# Patient Record
Sex: Male | Born: 1995 | Race: White | Hispanic: No | Marital: Single | Smoking: Never smoker
Health system: Southern US, Community
[De-identification: ages and names within clinical notes are randomized; demographics above are authoritative.]

---
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
## 2015-03-21 ENCOUNTER — Encounter: Payer: Self-pay | Admitting: Physician Assistant

## 2015-03-21 ENCOUNTER — Ambulatory Visit (INDEPENDENT_AMBULATORY_CARE_PROVIDER_SITE_OTHER): Payer: Medicaid Other | Admitting: Physician Assistant

## 2015-03-21 VITALS — BP 122/62 | HR 76 | Temp 97.8°F | Resp 16 | Wt 185.0 lb

## 2015-03-21 DIAGNOSIS — M62830 Muscle spasm of back: Secondary | ICD-10-CM | POA: Diagnosis not present

## 2015-03-21 DIAGNOSIS — S39012A Strain of muscle, fascia and tendon of lower back, initial encounter: Secondary | ICD-10-CM | POA: Diagnosis not present

## 2015-03-21 MED ORDER — PREDNISONE 10 MG PO TABS: ORAL_TABLET | ORAL | Status: DC

## 2015-03-21 MED ORDER — BACLOFEN 10 MG PO TABS: 10.0000 mg | ORAL_TABLET | Freq: Three times a day (TID) | ORAL | Status: DC

## 2015-03-21 NOTE — Patient Instructions (Signed)
Lumbosacral Strain Lumbosacral strain is a strain of any of the parts that make up your lumbosacral vertebrae. Your lumbosacral vertebrae are the bones that make up the lower third of your backbone. Your lumbosacral vertebrae are held together by muscles and tough, fibrous tissue (ligaments).  CAUSES  A sudden blow to your back can cause lumbosacral strain. Also, anything that causes an excessive stretch of the muscles in the low back can cause this strain. This is typically seen when people exert themselves strenuously, fall, lift heavy objects, bend, or crouch repeatedly. RISK FACTORS  Physically demanding work.  Participation in pushing or pulling sports or sports that require a sudden twist of the back (tennis, golf, baseball).  Weight lifting.  Excessive lower back curvature.  Forward-tilted pelvis.  Weak back or abdominal muscles or both.  Tight hamstrings. SIGNS AND SYMPTOMS  Lumbosacral strain may cause pain in the area of your injury or pain that moves (radiates) down your leg.  DIAGNOSIS Your health care provider can often diagnose lumbosacral strain through a physical exam. In some cases, you may need tests such as X-ray exams.  TREATMENT  Treatment for your lower back injury depends on many factors that your clinician will have to evaluate. However, most treatment will include the use of anti-inflammatory medicines. HOME CARE INSTRUCTIONS   Avoid hard physical activities (tennis, racquetball, waterskiing) if you are not in proper physical condition for it. This may aggravate or create problems.  If you have a back problem, avoid sports requiring sudden body movements. Swimming and walking are generally safer activities.  Maintain good posture.  Maintain a healthy weight.  For acute conditions, you may put ice on the injured area.  Put ice in a plastic bag.  Place a towel between your skin and the bag.  Leave the ice on for 20 minutes, 2-3 times a day.  When the  low back starts healing, stretching and strengthening exercises may be recommended. SEEK MEDICAL CARE IF:  Your back pain is getting worse.  You experience severe back pain not relieved with medicines. SEEK IMMEDIATE MEDICAL CARE IF:   You have numbness, tingling, weakness, or problems with the use of your arms or legs.  There is a change in bowel or bladder control.  You have increasing pain in any area of the body, including your belly (abdomen).  You notice shortness of breath, dizziness, or feel faint.  You feel sick to your stomach (nauseous), are throwing up (vomiting), or become sweaty.  You notice discoloration of your toes or legs, or your feet get very cold. MAKE SURE YOU:   Understand these instructions.  Will watch your condition.  Will get help right away if you are not doing well or get worse.   This information is not intended to replace advice given to you by your health care provider. Make sure you discuss any questions you have with your health care provider.   Document Released: 10/01/2004 Document Revised: 01/12/2014 Document Reviewed: 08/10/2012 Elsevier Interactive Patient Education 2016 Elsevier Inc. Muscle Cramps and Spasms Muscle cramps and spasms occur when a muscle or muscles tighten and you have no control over this tightening (involuntary muscle contraction). They are a common problem and can develop in any muscle. The most common place is in the calf muscles of the leg. Both muscle cramps and muscle spasms are involuntary muscle contractions, but they also have differences:   Muscle cramps are sporadic and painful. They may last a few seconds to a quarter  of an hour. Muscle cramps are often more forceful and last longer than muscle spasms. °· Muscle spasms may or may not be painful. They may also last just a few seconds or much longer. °CAUSES  °It is uncommon for cramps or spasms to be due to a serious underlying problem. In many cases, the cause of  cramps or spasms is unknown. Some common causes are:  °· Overexertion.   °· Overuse from repetitive motions (doing the same thing over and over).   °· Remaining in a certain position for a long period of time.   °· Improper preparation, form, or technique while performing a sport or activity.   °· Dehydration.   °· Injury.   °· Side effects of some medicines.   °· Abnormally low levels of the salts and ions in your blood (electrolytes), especially potassium and calcium. This could happen if you are taking water pills (diuretics) or you are pregnant.   °Some underlying medical problems can make it more likely to develop cramps or spasms. These include, but are not limited to:  °· Diabetes.   °· Parkinson disease.   °· Hormone disorders, such as thyroid problems.   °· Alcohol abuse.   °· Diseases specific to muscles, joints, and bones.   °· Blood vessel disease where not enough blood is getting to the muscles.   °HOME CARE INSTRUCTIONS  °· Stay well hydrated. Drink enough water and fluids to keep your urine clear or pale yellow. °· It may be helpful to massage, stretch, and relax the affected muscle. °· For tight or tense muscles, use a warm towel, heating pad, or hot shower water directed to the affected area. °· If you are sore or have pain after a cramp or spasm, applying ice to the affected area may relieve discomfort. °· Put ice in a plastic bag. °· Place a towel between your skin and the bag. °· Leave the ice on for 15-20 minutes, 03-04 times a day. °· Medicines used to treat a known cause of cramps or spasms may help reduce their frequency or severity. Only take over-the-counter or prescription medicines as directed by your caregiver. °SEEK MEDICAL CARE IF:  °Your cramps or spasms get more severe, more frequent, or do not improve over time.  °MAKE SURE YOU:  °· Understand these instructions. °· Will watch your condition. °· Will get help right away if you are not doing well or get worse. °  °This information is  not intended to replace advice given to you by your health care provider. Make sure you discuss any questions you have with your health care provider. °  °Document Released: 06/13/2001 Document Revised: 04/18/2012 Document Reviewed: 12/09/2011 °Elsevier Interactive Patient Education ©2016 Elsevier Inc. ° °
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
## 2016-06-05 ENCOUNTER — Ambulatory Visit (INDEPENDENT_AMBULATORY_CARE_PROVIDER_SITE_OTHER): Payer: Medicaid Other | Admitting: Family Medicine

## 2016-06-05 ENCOUNTER — Encounter: Payer: Self-pay | Admitting: Family Medicine

## 2016-06-05 ENCOUNTER — Other Ambulatory Visit: Payer: Self-pay | Admitting: Family Medicine

## 2016-06-05 ENCOUNTER — Telehealth: Payer: Self-pay

## 2016-06-05 VITALS — BP 102/74 | HR 80 | Temp 97.6°F | Resp 16 | Wt 169.0 lb

## 2016-06-05 DIAGNOSIS — L255 Unspecified contact dermatitis due to plants, except food: Secondary | ICD-10-CM | POA: Diagnosis not present

## 2016-06-05 MED ORDER — BETAMETHASONE DIPROPIONATE 0.05 % EX CREA: TOPICAL_CREAM | Freq: Two times a day (BID) | CUTANEOUS | 0 refills | Status: DC

## 2016-06-05 MED ORDER — METHYLPREDNISOLONE ACETATE 80 MG/ML IJ SUSP
80.0000 mg | Freq: Once | INTRAMUSCULAR | Status: AC
  Administered 2016-06-05: 80 mg via INTRAMUSCULAR

## 2016-06-05 MED ORDER — FLUOCINONIDE 0.05 % EX CREA: 1.0000 "application " | TOPICAL_CREAM | Freq: Three times a day (TID) | CUTANEOUS | 0 refills | Status: DC
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
## 2016-06-05 NOTE — Patient Instructions (Signed)
Continue antihistamines. Warm bath may help at night.

## 2016-06-05 NOTE — Telephone Encounter (Signed)
[facility] sent over 2 PA requests for different topical steroids for poison ivy. Per last e-prescription (Diprolene), pharmacist may select high potency topical steroid covered by patient' insurance. [facility] and [facility] informed me that the pharmacy filled Clobetasol ointment. Allene DillonEmily Drozdowski, CMA

## 2016-12-05 ENCOUNTER — Encounter: Payer: Self-pay | Admitting: Emergency Medicine

## 2016-12-05 ENCOUNTER — Emergency Department
Admission: EM | Admit: 2016-12-05 | Discharge: 2016-12-05 | Disposition: A | Discharge status: Home / Self Care | Payer: Medicaid Other | Attending: Emergency Medicine | Admitting: Emergency Medicine

## 2016-12-05 ENCOUNTER — Other Ambulatory Visit: Payer: Self-pay

## 2016-12-05 DIAGNOSIS — F17220 Nicotine dependence, chewing tobacco, uncomplicated: Secondary | ICD-10-CM | POA: Diagnosis not present

## 2016-12-05 DIAGNOSIS — S0181XA Laceration without foreign body of other part of head, initial encounter: Secondary | ICD-10-CM | POA: Diagnosis present

## 2016-12-05 DIAGNOSIS — Y999 Unspecified external cause status: Secondary | ICD-10-CM | POA: Diagnosis not present

## 2016-12-05 DIAGNOSIS — Z880 Allergy status to penicillin: Secondary | ICD-10-CM | POA: Insufficient documentation

## 2016-12-05 DIAGNOSIS — W2202XA Walked into lamppost, initial encounter: Secondary | ICD-10-CM | POA: Diagnosis not present

## 2016-12-05 DIAGNOSIS — Y929 Unspecified place or not applicable: Secondary | ICD-10-CM | POA: Diagnosis not present

## 2016-12-05 DIAGNOSIS — Y93K9 Activity, other involving animal care: Secondary | ICD-10-CM | POA: Insufficient documentation

## 2016-12-05 MED ORDER — LIDOCAINE HCL (PF) 1 % IJ SOLN
5.0000 mL | Freq: Once | INTRAMUSCULAR | Status: AC
  Administered 2016-12-05: 5 mL

## 2016-12-05 MED ORDER — LIDOCAINE HCL (PF) 1 % IJ SOLN
INTRAMUSCULAR | Status: AC
  Administered 2016-12-05: 5 mL
  Filled 2016-12-05: qty 5

## 2016-12-05 NOTE — ED Notes (Signed)
See triage note  States he was chasing a goat    Ran into a pole  Bruising and small laceration noted to left eye  Denies any other pain or LOC

## 2016-12-05 NOTE — ED Provider Notes (Signed)
[facility] Emergency Department Provider Note  ____________________________________________  Time seen: Approximately 3:41 PM  I have reviewed the triage vital signs and the nursing notes.   HISTORY  Chief Complaint Laceration    HPI Roxy MannsJonathan J Savitz is a 21 y.o. male presents to the emergency department with a 2 cm vertical laceration along left forehead after he collided with a pole while chasing a goat.  He denies loss of consciousness.  He denies radiculopathy, weakness or changes in sensation in the upper or lower extremities.  No alleviating measures have been attempted aside from the application of a clean dressing.   History reviewed. No pertinent past medical history.  There are no active problems to display for this patient.   History reviewed. No pertinent surgical history.  Prior to Admission medications   Medication Sig Start Date End Date Taking? Authorizing Provider  betamethasone dipropionate (DIPROLENE) 0.05 % cream Apply topically 2 (two) times daily. To poison ivy rash 06/05/16   Anola Gurneyhauvin, Robert, PA    Allergies Amoxicillin; Azithromycin; Benadryl [diphenhydramine hcl (sleep)]; and Cephalosporins  No family history on file.  Social History Social History   Tobacco Use  . Smoking status: Never Smoker  . Smokeless tobacco: Current User    Types: Snuff  Substance Use Topics  . Alcohol use: No    Alcohol/week: 0.0 oz  . Drug use: Not on file     Review of Systems  Constitutional: No fever/chills Eyes: No visual changes. No discharge ENT: No upper respiratory complaints. Cardiovascular: no chest pain. Respiratory: no cough. No SOB. Gastrointestinal: No abdominal pain.  No nausea, no vomiting.  No diarrhea.  No constipation. Musculoskeletal: Negative for musculoskeletal pain. Skin: Patient has laceration along the left forehead.  Neurological: Negative for headaches, focal weakness or  numbness.   ____________________________________________   PHYSICAL EXAM:  VITAL SIGNS: ED Triage Vitals  Enc Vitals Group     BP 12/05/16 1258 (!) 142/82     Pulse Rate 12/05/16 1258 94     Resp 12/05/16 1258 18     Temp 12/05/16 1258 98.3 F (36.8 C)     Temp Source 12/05/16 1258 Oral     SpO2 12/05/16 1258 99 %     Weight 12/05/16 1259 180 lb (81.6 kg)     Height 12/05/16 1259 5\' 11"  (1.803 m)     Head Circumference --      Peak Flow --      Pain Score 12/05/16 1257 2     Pain Loc --      Pain Edu? --      Excl. in GC? --      Constitutional: Alert and oriented. Well appearing and in no acute distress. Eyes: Conjunctivae are normal. PERRL. EOMI. Head: Atraumatic. Cardiovascular: Normal rate, regular rhythm. Normal S1 and S2.  Good peripheral circulation. Respiratory: Normal respiratory effort without tachypnea or retractions. Lungs CTAB. Good air entry to the bases with no decreased or absent breath sounds. Gastrointestinal: Bowel sounds 4 quadrants. Soft and nontender to palpation. No guarding or rigidity. No palpable masses. No distention. No CVA tenderness. Musculoskeletal: Full range of motion to all extremities. No gross deformities appreciated. Neurologic:  Normal speech and language. No gross focal neurologic deficits are appreciated.  Skin: Patient has 2 cm vertical laceration along left forehead. Psychiatric: Mood and affect are normal. Speech and behavior are normal. Patient exhibits appropriate insight and judgement.   ____________________________________________   LABS (all labs ordered are listed, but only  abnormal results are displayed)  Labs Reviewed - No data to display ____________________________________________  EKG   ____________________________________________  RADIOLOGY   No results found.  ____________________________________________    PROCEDURES  Procedure(s) performed:    Procedures  LACERATION REPAIR Performed by:  Orvil FeilJaclyn M Crosley Stejskal Authorized by: Orvil FeilJaclyn M Elishah Ashmore Consent: Verbal consent obtained. Risks and benefits: risks, benefits and alternatives were discussed Consent given by: patient Patient identity confirmed: provided demographic data Prepped and Draped in normal sterile fashion Wound explored  Laceration Location: Left forehead  Laceration Length: 2 cm  No Foreign Bodies seen or palpated  Anesthesia: local infiltration  Local anesthetic: lidocaine 1% without epinephrine  Anesthetic total: 3 ml  Irrigation method: syringe Amount of cleaning: standard  Skin closure: 5-0 Ethilon   Number of sutures: 3  Technique: Simple Interrupted   Patient tolerance: Patient tolerated the procedure well with no immediate complications.   Medications  lidocaine (PF) (XYLOCAINE) 1 % injection 5 mL (5 mLs Infiltration Given 12/05/16 1459)     ____________________________________________   INITIAL IMPRESSION / ASSESSMENT AND PLAN / ED COURSE  Pertinent labs & imaging results that were available during my care of the patient were reviewed by me and considered in my medical decision making (see chart for details).  Review of the [place] CSRS was performed in accordance of the [facility] prior to dispensing any controlled drugs.    Assessment and plan Laceration Patient presents to the emergency department with a 2 cm vertical laceration along left forehead.  Patient underwent laceration repair without complication.  He was advised to have sutures removed by primary care in 5 days.  Vital signs were reassuring prior to discharge.  All patient questions were answered. .   ____________________________________________  FINAL CLINICAL IMPRESSION(S) / ED DIAGNOSES  Final diagnoses:  Laceration of forehead, initial encounter      NEW MEDICATIONS STARTED DURING THIS VISIT:  ED Discharge Orders    None          This chart was dictated using voice recognition software/Dragon. Despite best  efforts to proofread, errors can occur which can change the meaning. Any change was purely unintentional.    Orvil FeilWoods, Tayson Schnelle M, PA-C 12/05/16 1547    Merrily Brittleifenbark, Neil, MD 12/06/16 1332

## 2016-12-05 NOTE — ED Triage Notes (Signed)
Hit face on steel pole while chasing a goat approx 12noon. No LOC. Small lac above and below L eye.

## 2017-06-08 ENCOUNTER — Ambulatory Visit (INDEPENDENT_AMBULATORY_CARE_PROVIDER_SITE_OTHER): Payer: Self-pay | Admitting: Physician Assistant

## 2017-06-08 ENCOUNTER — Encounter: Payer: Self-pay | Admitting: Physician Assistant

## 2017-06-08 VITALS — BP 110/78 | HR 73 | Temp 97.6°F | Resp 16 | Wt 183.4 lb

## 2017-06-08 DIAGNOSIS — T63301A Toxic effect of unspecified spider venom, accidental (unintentional), initial encounter: Secondary | ICD-10-CM

## 2017-06-08 MED ORDER — SULFAMETHOXAZOLE-TRIMETHOPRIM 800-160 MG PO TABS: 1.0000 | ORAL_TABLET | Freq: Two times a day (BID) | ORAL | 0 refills | Status: DC

## 2017-06-08 NOTE — Progress Notes (Signed)
       Patient: Dennis Rosales Male    DOB: 1995-05-12   22 y.o.   MRN: [id number] Visit Date: 06/08/2017  Today's Provider: Margaretann LovelessJennifer M Burnette, PA-C   Chief Complaint  Patient presents with  . Insect Bite   Subjective:    HPI Patient is coming in today with c/o possible spider bite,Left arm. Noticed it Saturday morning.Associated Symptoms:swelling, redness, and pain around the bite.  Patient reports his arm feels numb all the way to his elbow.Treatments tried: Epsom salt,Neosporin and IBU.     Allergies  Allergen Reactions  . Amoxicillin Other (See Comments)    Unknown-reaction not told to us  . Azithromycin Other (See Comments)    unknown  . Benadryl [Diphenhydramine Hcl (Sleep)] Nausea Only  . Cephalosporins Other (See Comments)    UNK     Current Outpatient Medications:  .  betamethasone dipropionate (DIPROLENE) 0.05 % cream, Apply topically 2 (two) times daily. To poison ivy rash, Disp: 30 g, Rfl: 0  Review of Systems  Constitutional: Negative.   Respiratory: Negative.   Cardiovascular: Negative.   Musculoskeletal: Positive for myalgias.  Skin: Positive for rash.  Neurological: Positive for weakness and numbness.    Social History   Tobacco Use  . Smoking status: Never Smoker  . Smokeless tobacco: Current User    Types: Snuff  Substance Use Topics  . Alcohol use: No    Alcohol/week: 0.0 oz   Objective:   BP 110/78 (BP Location: Right Arm, Patient Position: Sitting, Cuff Size: Normal)   Pulse 73   Temp 97.6 F (36.4 C) (Oral)   Resp 16   Wt 183 lb 6.4 oz (83.2 kg)   SpO2 98%   BMI 25.58 kg/m    Physical Exam  Constitutional: He appears well-developed and well-nourished. No distress.  HENT:  Head: Normocephalic and atraumatic.  Neck: Normal range of motion. Neck supple.  Cardiovascular: Normal rate, regular rhythm and normal heart sounds. Exam reveals no gallop and no friction rub.  No murmur heard. Pulmonary/Chest: Effort normal and  breath sounds normal. No respiratory distress. He has no wheezes. He has no rales.  Skin: He is not diaphoretic.  See photo below  Vitals reviewed.         Assessment & Plan:     1. Spider bite wound, accidental or unintentional, initial encounter Suspicious appearance for brown recluse bite. Reports went to sleep Saturday and woke up with small pimple area that was tender. It has since continued to progress. Now having arm soreness with movements as well. Will start Bactrim as below. Close follow up scheduled with Dr. Beryle FlockBacigalupo (unfortunately I was booked and could not see him) scheduled for Thursday (June 6) at 8:00am. Call if symptoms worsen.  - sulfamethoxazole-trimethoprim (BACTRIM DS,SEPTRA DS) 800-160 MG tablet; Take 1 tablet by mouth 2 (two) times daily.  Dispense: 20 tablet; Refill: 0       Margaretann LovelessJennifer M Burnette, PA-C  [facility]

## 2017-06-10 ENCOUNTER — Encounter: Payer: Self-pay | Admitting: Family Medicine

## 2017-06-10 ENCOUNTER — Ambulatory Visit (INDEPENDENT_AMBULATORY_CARE_PROVIDER_SITE_OTHER): Payer: Medicaid Other | Admitting: Family Medicine

## 2017-06-10 VITALS — BP 120/68 | HR 56 | Temp 97.5°F | Resp 16 | Wt 184.0 lb

## 2017-06-10 DIAGNOSIS — T63301D Toxic effect of unspecified spider venom, accidental (unintentional), subsequent encounter: Secondary | ICD-10-CM

## 2017-06-10 NOTE — Progress Notes (Signed)
Patient: Dennis Rosales Male    DOB: 10-08-1995   21 y.o.   MRN: [id number] Visit Date: 06/10/2017  Today's Provider: Shirlee Latch, MD   I, Joslyn Hy, CMA, am acting as scribe for Shirlee Latch, MD.  Chief Complaint  Patient presents with  . Insect Bite   Subjective:    HPI     Follow up for Spider Bite  The patient was last seen for possible brown recluse bite 2 days ago. Changes made at last visit include starting Bactrim.  He reports good compliance with treatment. He feels that condition is Improved. Pt states the soreness has improved, but the area itches. He is not having side effects.  ------------------------------------------------------------------------------------    Allergies  Allergen Reactions  . Amoxicillin Other (See Comments)    Unknown-reaction not told to [place]  . Azithromycin Other (See Comments)    unknown  . Benadryl [Diphenhydramine Hcl (Sleep)] Nausea Only  . Cephalosporins Other (See Comments)    UNK     Current Outpatient Medications:  .  sulfamethoxazole-trimethoprim (BACTRIM DS,SEPTRA DS) 800-160 MG tablet, Take 1 tablet by mouth 2 (two) times daily., Disp: 20 tablet, Rfl: 0  Review of Systems  Constitutional: Positive for appetite change (increased). Negative for activity change, chills, diaphoresis, fatigue, fever and unexpected weight change.  Respiratory: Negative for shortness of breath.   Cardiovascular: Negative for chest pain, palpitations and leg swelling.  Skin: Positive for wound.    Social History   Tobacco Use  . Smoking status: Never Smoker  . Smokeless tobacco: Current User    Types: Snuff  Substance Use Topics  . Alcohol use: No    Alcohol/week: 0.0 oz   Objective:   BP 120/68 (BP Location: Right Arm, Patient Position: Sitting, Cuff Size: Large)   Pulse (!) 56   Temp (!) 97.5 F (36.4 C) (Oral)   Resp 16   Wt 184 lb (83.5 kg)   SpO2 99%   BMI 25.66 kg/m  Vitals:   06/10/17 0808    BP: 120/68  Pulse: (!) 56  Resp: 16  Temp: (!) 97.5 F (36.4 C)  TempSrc: Oral  SpO2: 99%  Weight: 184 lb (83.5 kg)     Physical Exam  Constitutional: He is oriented to person, place, and time. He appears well-developed and well-nourished. No distress.  HENT:  Head: Normocephalic and atraumatic.  Eyes: Conjunctivae are normal. Right eye exhibits no discharge. Left eye exhibits no discharge. No scleral icterus.  Cardiovascular: Normal rate, regular rhythm, normal heart sounds and intact distal pulses.  No murmur heard. Pulmonary/Chest: Effort normal and breath sounds normal. No respiratory distress. He has no wheezes. He has no rales.  Musculoskeletal: He exhibits no edema.  Neurological: He is alert and oriented to person, place, and time.  Skin: Skin is warm and dry. Capillary refill takes less than 2 seconds.  See picture.  No surrounding erythema.  No induration.  Area of central necrosis is smaller than previously  Psychiatric: He has a normal mood and affect. His behavior is normal.  Vitals reviewed.          Assessment & Plan:   1. Spider bite wound, accidental or unintentional, subsequent encounter - improving - complete course of Bactrim - no indication for I&D - return precautions discussed   Return if symptoms worsen or fail to improve.   The entirety of the information documented in the History of Present Illness, Review of Systems and Physical Exam were  personally obtained by me. Portions of this information were initially documented by Irving BurtonEmily Ratchford, CMA and reviewed by me for thoroughness and accuracy.    Erasmo DownerBacigalupo, Shamyah Stantz M, MD, MPH [facility] 06/10/2017 8:27 AM

## 2018-11-02 ENCOUNTER — Other Ambulatory Visit: Payer: Self-pay

## 2018-11-02 ENCOUNTER — Encounter: Payer: Self-pay | Admitting: Physician Assistant

## 2018-11-02 ENCOUNTER — Ambulatory Visit (INDEPENDENT_AMBULATORY_CARE_PROVIDER_SITE_OTHER): Payer: Self-pay | Admitting: Physician Assistant

## 2018-11-02 VITALS — BP 114/70 | HR 66 | Temp 97.3°F | Resp 16 | Wt 210.2 lb

## 2018-11-02 DIAGNOSIS — B029 Zoster without complications: Secondary | ICD-10-CM

## 2018-11-02 MED ORDER — VALACYCLOVIR HCL 1 G PO TABS: 1000.0000 mg | ORAL_TABLET | Freq: Three times a day (TID) | ORAL | 0 refills | Status: DC

## 2018-11-02 NOTE — Progress Notes (Signed)
Patient: Dennis Rosales Male    DOB: 01/15/1995   23 y.o.   MRN: [id number] Visit Date: 11/02/2018  Today's Provider: Mar Daring, PA-C   Chief Complaint  Patient presents with  . Rash   Subjective:     Rash This is a new problem. The current episode started in the past 7 days. The problem has been gradually worsening since onset. The affected locations include the back and chest. The rash is characterized by burning, blistering, pain, redness and itchiness. It is unknown if there was an exposure to a precipitant. Pertinent negatives include no anorexia, congestion, cough, diarrhea, eye pain, facial edema, fatigue, fever, joint pain, nail changes, rhinorrhea, shortness of breath, sore throat or vomiting. Treatments tried: hydrocortisone cream. The treatment provided no relief.    Allergies  Allergen Reactions  . Amoxicillin Other (See Comments)    Unknown-reaction not told to [place]  . Azithromycin Other (See Comments)    unknown  . Benadryl [Diphenhydramine Hcl (Sleep)] Nausea Only  . Cephalosporins Other (See Comments)    UNK    No current outpatient medications on file.  Review of Systems  Constitutional: Negative for fatigue and fever.  HENT: Negative for congestion, rhinorrhea and sore throat.   Eyes: Negative for pain.  Respiratory: Negative for cough and shortness of breath.   Gastrointestinal: Negative for anorexia, diarrhea and vomiting.  Musculoskeletal: Negative for joint pain.  Skin: Positive for rash. Negative for nail changes.    Social History   Tobacco Use  . Smoking status: Never Smoker  . Smokeless tobacco: Current User    Types: Snuff  Substance Use Topics  . Alcohol use: No    Alcohol/week: 0.0 standard drinks      Objective:   BP 114/70   Pulse 66   Temp (!) 97.3 F (36.3 C) (Oral)   Resp 16   Wt 210 lb 3.2 oz (95.3 kg)   BMI 29.32 kg/m  Vitals:   11/02/18 1614  BP: 114/70  Pulse: 66  Resp: 16  Temp: (!) 97.3 F  (36.3 C)  TempSrc: Oral  Weight: 210 lb 3.2 oz (95.3 kg)  Body mass index is 29.32 kg/m.   Physical Exam Vitals signs reviewed.  Constitutional:      General: He is not in acute distress.    Appearance: Normal appearance. He is well-developed. He is not ill-appearing or diaphoretic.  HENT:     Head: Normocephalic and atraumatic.  Neck:     Musculoskeletal: Normal range of motion and neck supple.  Cardiovascular:     Rate and Rhythm: Normal rate and regular rhythm.     Heart sounds: Normal heart sounds. No murmur. No friction rub. No gallop.   Pulmonary:     Effort: Pulmonary effort is normal. No respiratory distress.     Breath sounds: Normal breath sounds. No wheezing or rales.  Skin:    Findings: Rash present. Rash is papular and vesicular.          Comments: Vesicular rash in dermatomal pattern around the right side chest and upper back  Neurological:     Mental Status: He is alert.      No results found for any visits on 11/02/18.     Assessment & Plan    1. Herpes zoster without complication Treat with Valtrex as below. Discussed suspected course of rash. Discussed ability to spread and high risk populations to avoid. He voices understanding. Call if not improving  or worsening.  - valACYclovir (VALTREX) 1000 MG tablet; Take 1 tablet (1,000 mg total) by mouth 3 (three) times daily.  Dispense: 21 tablet; Refill: 0     Margaretann Loveless, PA-C  [facility]

## 2018-11-02 NOTE — Patient Instructions (Signed)
Shingles  Shingles, which is also known as herpes zoster, is an infection that causes a painful skin rash and fluid-filled blisters. It is caused by a virus. Shingles only develops in people who:  Have had chickenpox.  Have been given a medicine to protect against chickenpox (have been vaccinated). Shingles is rare in this group. What are the causes? Shingles is caused by varicella-zoster virus (VZV). This is the same virus that causes chickenpox. After a person is exposed to VZV, the virus stays in the body in an inactive (dormant) state. Shingles develops if the virus is reactivated. This can happen many years after the first (initial) exposure to VZV. It is not known what causes this virus to be reactivated. What increases the risk? People who have had chickenpox or received the chickenpox vaccine are at risk for shingles. Shingles infection is more common in people who:  Are older than age 60.  Have a weakened disease-fighting system (immune system), such as people with: ? HIV. ? AIDS. ? Cancer.  Are taking medicines that weaken the immune system, such as transplant medicines.  Are experiencing a lot of stress. What are the signs or symptoms? Early symptoms of this condition include itching, tingling, and pain in an area on your skin. Pain may be described as burning, stabbing, or throbbing. A few days or weeks after early symptoms start, a painful red rash appears. The rash is usually on one side of the body and has a band-like or belt-like pattern. The rash eventually turns into fluid-filled blisters that break open, change into scabs, and dry up in about 2-3 weeks. At any time during the infection, you may also develop:  A fever.  Chills.  A headache.  An upset stomach. How is this diagnosed? This condition is diagnosed with a skin exam. Skin or fluid samples may be taken from the blisters before a diagnosis is made. These samples are examined under a microscope or sent to  a lab for testing. How is this treated? The rash may last for several weeks. There is not a specific cure for this condition. Your health care provider will probably prescribe medicines to help you manage pain, recover more quickly, and avoid long-term problems. Medicines may include:  Antiviral drugs.  Anti-inflammatory drugs.  Pain medicines.  Anti-itching medicines (antihistamines). If the area involved is on your face, you may be referred to a specialist, such as an eye doctor (ophthalmologist) or an ear, nose, and throat (ENT) doctor (otolaryngologist) to help you avoid eye problems, chronic pain, or disability. Follow these instructions at home: Medicines  Take over-the-counter and prescription medicines only as told by your health care provider.  Apply an anti-itch cream or numbing cream to the affected area as told by your health care provider. Relieving itching and discomfort   Apply cold, wet cloths (cold compresses) to the area of the rash or blisters as told by your health care provider.  Cool baths can be soothing. Try adding baking soda or dry oatmeal to the water to reduce itching. Do not bathe in hot water. Blister and rash care  Keep your rash covered with a loose bandage (dressing). Wear loose-fitting clothing to help ease the pain of material rubbing against the rash.  Keep your rash and blisters clean by washing the area with mild soap and cool water as told by your health care provider.  Check your rash every day for signs of infection. Check for: ? More redness, swelling, or pain. ? Fluid   or blood. ? Warmth. ? Pus or a bad smell.  Do not scratch your rash or pick at your blisters. To help avoid scratching: ? Keep your fingernails clean and cut short. ? Wear gloves or mittens while you sleep, if scratching is a problem. General instructions  Rest as told by your health care provider.  Keep all follow-up visits as told by your health care provider. This  is important.  Wash your hands often with soap and water. If soap and water are not available, use hand sanitizer. Doing this lowers your chance of getting a bacterial skin infection.  Before your blisters change into scabs, your shingles infection can cause chickenpox in people who have never had it or have never been vaccinated against it. To prevent this from happening, avoid contact with other people, especially: ? Babies. ? Pregnant women. ? Children who have eczema. ? Elderly people who have transplants. ? People who have chronic illnesses, such as cancer or AIDS. Contact a health care provider if:  Your pain is not relieved with prescribed medicines.  Your pain does not get better after the rash heals.  You have signs of infection in the rash area, such as: ? More redness, swelling, or pain around the rash. ? Fluid or blood coming from the rash. ? The rash area feeling warm to the touch. ? Pus or a bad smell coming from the rash. Get help right away if:  The rash is on your face or nose.  You have facial pain, pain around your eye area, or loss of feeling on one side of your face.  You have difficulty seeing.  You have ear pain or have ringing in your ear.  You have a loss of taste.  Your condition gets worse. Summary  Shingles, which is also known as herpes zoster, is an infection that causes a painful skin rash and fluid-filled blisters.  This condition is diagnosed with a skin exam. Skin or fluid samples may be taken from the blisters and examined before the diagnosis is made.  Keep your rash covered with a loose bandage (dressing). Wear loose-fitting clothing to help ease the pain of material rubbing against the rash.  Before your blisters change into scabs, your shingles infection can cause chickenpox in people who have never had it or have never been vaccinated against it. This information is not intended to replace advice given to you by your health care  provider. Make sure you discuss any questions you have with your health care provider. Document Released: 12/22/2004 Document Revised: 04/15/2018 Document Reviewed: 08/26/2016 Elsevier Patient Education  2020 [employer] Inc.  

## 2019-02-08 ENCOUNTER — Ambulatory Visit
Admission: EM | Admit: 2019-02-08 | Discharge: 2019-02-08 | Disposition: A | Discharge status: Home / Self Care | Payer: BC Managed Care – PPO | Attending: Family | Admitting: Family

## 2019-02-08 ENCOUNTER — Other Ambulatory Visit: Payer: Self-pay

## 2019-02-08 DIAGNOSIS — Z888 Allergy status to other drugs, medicaments and biological substances status: Secondary | ICD-10-CM | POA: Diagnosis not present

## 2019-02-08 DIAGNOSIS — Z20822 Contact with and (suspected) exposure to covid-19: Secondary | ICD-10-CM | POA: Insufficient documentation

## 2019-02-08 DIAGNOSIS — J039 Acute tonsillitis, unspecified: Secondary | ICD-10-CM | POA: Diagnosis not present

## 2019-02-08 DIAGNOSIS — Z881 Allergy status to other antibiotic agents status: Secondary | ICD-10-CM | POA: Diagnosis not present

## 2019-02-08 DIAGNOSIS — J029 Acute pharyngitis, unspecified: Secondary | ICD-10-CM

## 2019-02-08 DIAGNOSIS — Z880 Allergy status to penicillin: Secondary | ICD-10-CM | POA: Diagnosis not present

## 2019-02-08 DIAGNOSIS — G4489 Other headache syndrome: Secondary | ICD-10-CM | POA: Diagnosis not present

## 2019-02-08 DIAGNOSIS — R519 Headache, unspecified: Secondary | ICD-10-CM

## 2019-02-08 LAB — CBC WITH DIFFERENTIAL/PLATELET
Abs Immature Granulocytes: 0.08 10*3/uL — ABNORMAL HIGH (ref 0.00–0.07)
Basophils Absolute: 0 10*3/uL (ref 0.0–0.1)
Basophils Relative: 0 %
Eosinophils Absolute: 0 10*3/uL (ref 0.0–0.5)
Eosinophils Relative: 0 %
HCT: 44.2 % (ref 39.0–52.0)
Hemoglobin: 15.4 g/dL (ref 13.0–17.0)
Immature Granulocytes: 1 %
Lymphocytes Relative: 6 %
Lymphs Abs: 0.7 10*3/uL (ref 0.7–4.0)
MCH: 32.3 pg (ref 26.0–34.0)
MCHC: 34.8 g/dL (ref 30.0–36.0)
MCV: 92.7 fL (ref 80.0–100.0)
Monocytes Absolute: 1.3 10*3/uL — ABNORMAL HIGH (ref 0.1–1.0)
Monocytes Relative: 11 %
Neutro Abs: 10.4 10*3/uL — ABNORMAL HIGH (ref 1.7–7.7)
Neutrophils Relative %: 82 %
Platelets: 204 10*3/uL (ref 150–400)
RBC: 4.77 MIL/uL (ref 4.22–5.81)
RDW: 11.9 % (ref 11.5–15.5)
WBC: 12.6 10*3/uL — ABNORMAL HIGH (ref 4.0–10.5)
nRBC: 0 % (ref 0.0–0.2)

## 2019-02-08 LAB — MONONUCLEOSIS SCREEN: Mono Screen: NEGATIVE

## 2019-02-08 LAB — GROUP A STREP BY PCR: Group A Strep by PCR: NOT DETECTED

## 2019-02-08 MED ORDER — ACETAMINOPHEN 500 MG PO TABS
1000.0000 mg | ORAL_TABLET | Freq: Once | ORAL | Status: AC
  Administered 2019-02-08: 10:00:00 1000 mg via ORAL

## 2019-02-08 MED ORDER — CLINDAMYCIN HCL 300 MG PO CAPS: 300.0000 mg | ORAL_CAPSULE | Freq: Three times a day (TID) | ORAL | 0 refills | Status: AC

## 2019-02-08 NOTE — ED Provider Notes (Signed)
[facility]    CSN: [id number] Arrival date & time: 02/08/19  0803      History   Chief Complaint Chief Complaint  Patient presents with  . Headache  . Sore Throat    HPI Dennis Rosales is a 24 y.o. male.   24 year old male presents with headache and sore throat that started yesterday morning. Last night had a fever of 100 and this morning had a fever of 102. Minimal nasal congestion and denies any cough or GI symptoms. Has taken Ibuprofen with some relief. No known exposure to strep, mono or COVID 19. No other chronic health issues. Takes no daily medication.   The history is provided by the patient.    History reviewed. No pertinent past medical history.  There are no problems to display for this patient.   History reviewed. No pertinent surgical history.     Home Medications    Prior to Admission medications   Medication Sig Start Date End Date Taking? Authorizing Provider  ibuprofen (ADVIL) 400 MG tablet Take 400 mg by mouth every 6 (six) hours as needed.   Yes [provider]  clindamycin (CLEOCIN) 300 MG capsule Take 1 capsule (300 mg total) by mouth 3 (three) times daily for 10 days. 02/08/19 02/18/19  Sudie Grumbling, NP    Family History Family History  Problem Relation Age of Onset  . Healthy Mother   . Healthy Father     Social History Social History   Tobacco Use  . Smoking status: Never Smoker  . Smokeless tobacco: Current User    Types: Snuff  Substance Use Topics  . Alcohol use: No    Alcohol/week: 0.0 standard drinks  . Drug use: Not on file     Allergies   Amoxicillin, Azithromycin, Benadryl [diphenhydramine hcl (sleep)], and Cephalosporins   Review of Systems Review of Systems  Constitutional: Positive for fatigue and fever. Negative for activity change, appetite change, chills and diaphoresis.  HENT: Positive for congestion (minimal nasal), postnasal drip, sore throat and trouble swallowing. Negative for ear  discharge, ear pain, mouth sores, nosebleeds, rhinorrhea, sinus pressure, sinus pain and sneezing.   Eyes: Negative for pain, discharge, redness and itching.  Respiratory: Negative for cough, chest tightness, shortness of breath and wheezing.   Gastrointestinal: Negative for abdominal pain, diarrhea, nausea and vomiting.  Musculoskeletal: Negative for arthralgias, myalgias, neck pain and neck stiffness.  Skin: Negative for color change, rash and wound.  Neurological: Positive for headaches. Negative for dizziness, tremors, seizures, syncope, weakness, light-headedness and numbness.  Hematological: Negative for adenopathy. Does not bruise/bleed easily.     Physical Exam Triage Vital Signs ED Triage Vitals  Enc Vitals Group     BP 02/08/19 0839 122/85     Pulse Rate 02/08/19 0839 92     Resp --      Temp 02/08/19 0839 98.2 F (36.8 C)     Temp Source 02/08/19 0839 Oral     SpO2 02/08/19 0839 98 %     Weight 02/08/19 0837 190 lb (86.2 kg)     Height 02/08/19 0837 5\' 11"  (1.803 m)     Head Circumference --      Peak Flow --      Pain Score 02/08/19 0837 0     Pain Loc --      Pain Edu? --      Excl. in GC? --    No data found.  Updated Vital Signs BP 122/85 (  BP Location: Left Arm)   Pulse 92   Temp 98.2 F (36.8 C) (Oral)   Ht 5\' 11"  (1.803 m)   Wt 190 lb (86.2 kg)   SpO2 98%   BMI 26.50 kg/m   Visual Acuity Right Eye Distance:   Left Eye Distance:   Bilateral Distance:    Right Eye Near:   Left Eye Near:    Bilateral Near:     Physical Exam Vitals and nursing note reviewed.  Constitutional:      General: He is awake. He is not in acute distress.    Appearance: He is well-developed and well-groomed. He is ill-appearing.     Comments: Patient sitting comfortably in exam chair in no acute distress but appears ill.   HENT:     Head: Normocephalic and atraumatic.     Right Ear: Hearing, tympanic membrane, ear canal and external ear normal.     Left Ear: Hearing,  tympanic membrane, ear canal and external ear normal.     Nose: Nose normal. No congestion or rhinorrhea.     Right Sinus: No maxillary sinus tenderness or frontal sinus tenderness.     Left Sinus: No maxillary sinus tenderness or frontal sinus tenderness.     Mouth/Throat:     Lips: Pink.     Mouth: Mucous membranes are moist.     Pharynx: Uvula midline. Posterior oropharyngeal erythema present. No pharyngeal swelling or oropharyngeal exudate.     Tonsils: Tonsillar exudate (white to gray ) present. No tonsillar abscesses. 3+ on the right. 3+ on the left.  Eyes:     Extraocular Movements: Extraocular movements intact.     Conjunctiva/sclera: Conjunctivae normal.  Cardiovascular:     Rate and Rhythm: Normal rate and regular rhythm.     Heart sounds: Normal heart sounds. No murmur.  Pulmonary:     Effort: Pulmonary effort is normal. No respiratory distress.     Breath sounds: Normal breath sounds and air entry. No decreased air movement. No decreased breath sounds, wheezing, rhonchi or rales.     Comments: Resp Rate = 18. Musculoskeletal:        General: Normal range of motion.     Cervical back: Normal range of motion and neck supple.  Lymphadenopathy:     Cervical: Cervical adenopathy present.     Right cervical: Superficial cervical adenopathy present. No posterior cervical adenopathy.    Left cervical: Superficial cervical adenopathy present. No posterior cervical adenopathy.  Skin:    General: Skin is warm and dry.     Capillary Refill: Capillary refill takes less than 2 seconds.     Findings: No rash.  Neurological:     General: No focal deficit present.     Mental Status: He is alert and oriented to person, place, and time.  Psychiatric:        Mood and Affect: Mood normal.        Behavior: Behavior normal. Behavior is cooperative.        Thought Content: Thought content normal.        Judgment: Judgment normal.      UC Treatments / Results  Labs (all labs ordered  are listed, but only abnormal results are displayed) Labs Reviewed  CBC WITH DIFFERENTIAL/PLATELET - Abnormal; Notable for the following components:      Result Value   WBC 12.6 (*)    Neutro Abs 10.4 (*)    Monocytes Absolute 1.3 (*)    Abs Immature Granulocytes 0.08 (*)  All other components within normal limits  GROUP A STREP BY PCR  NOVEL CORONAVIRUS, NAA (HOSP ORDER, SEND-OUT TO REF LAB; TAT 18-24 HRS)  MONONUCLEOSIS SCREEN    EKG   Radiology No results found.  Procedures Procedures (including critical care time)  Medications Ordered in UC Medications  acetaminophen (TYLENOL) tablet 1,000 mg (1,000 mg Oral Given 02/08/19 0947)    Initial Impression / Assessment and Plan / UC Course  I have reviewed the triage vital signs and the nursing notes.  Pertinent labs & imaging results that were available during my care of the patient were reviewed by me and considered in my medical decision making (see chart for details).    Patient complained of worsening of headache while CMA went in to draw lab work. Gave Tylenol 1000mg  orally. Patient indicated that headache pain had improved within 15 to 20 minutes.  Reviewed negative rapid strep test with patient. Also reviewed negative rapid mono test results. Discussed CBC results showing elevated WBC and Neutrophil count- discussed possible bacterial tonsillitis. Can not rule out COVID 19. Due to multiple allergies to antibiotics, will start on Clindamycin 300mg  3 times a day for 10 days. Continue to take Ibuprofen 600mg  every 8 hours and alternate with Tylenol 1000mg  to help with headache and sore throat. Rest. Continue to push fluids. Stay at home. Note written for work. Follow-up pending COVID 19 test results and in 3 to 4 days with his PCP if not improving.  Final Clinical Impressions(s) / UC Diagnoses   Final diagnoses:  Acute tonsillitis, unspecified etiology  Other headache syndrome  Sore throat     Discharge Instructions      Recommend start Clindamycin 300mg  every 8 hours (3 times a day) for 10 days. Continue to take Ibuprofen 600mg  every 6 to 8 hours as needed for headache and sore throat. May also alternate with Tylenol 1000mg - no more than 3000mg  of Tylenol in 24 hours. Rest. Stay at home. Follow-up pending COVID 19 test results and with your PCP in 3 to 4 days if not improving.     ED Prescriptions    Medication Sig Dispense Auth. Provider   clindamycin (CLEOCIN) 300 MG capsule Take 1 capsule (300 mg total) by mouth 3 (three) times daily for 10 days. 30 capsule Darlene Brozowski, Nicholes Stairs, NP     PDMP not reviewed this encounter.   Katy Apo, NP 02/08/19 1046

## 2019-02-08 NOTE — ED Triage Notes (Addendum)
Pt presents with c/o headache, sore throat, nasal congestion and drainage/phlegm. Pt denies cough or n/v/d. Pt reports temp this morning of 102. Pt denies any known sick contacts.

## 2019-02-08 NOTE — Discharge Instructions (Addendum)
Recommend start Clindamycin 300mg  every 8 hours (3 times a day) for 10 days. Continue to take Ibuprofen 600mg  every 6 to 8 hours as needed for headache and sore throat. May also alternate with Tylenol 1000mg - no more than 3000mg  of Tylenol in 24 hours. Rest. Stay at home. Follow-up pending COVID 19 test results and with your PCP in 3 to 4 days if not improving.

## 2019-02-09 LAB — NOVEL CORONAVIRUS, NAA (HOSP ORDER, SEND-OUT TO REF LAB; TAT 18-24 HRS): SARS-CoV-2, NAA: NOT DETECTED

## 2021-04-29 NOTE — Progress Notes (Signed)
? ?  Dennis Rosales,acting as a scribe for Dennis Mans, MD.,have documented all relevant documentation on the behalf of Dennis Mans, MD,as directed by  Dennis Mans, MD while in the presence of Dennis Mans, MD. ? ?Acute Office Visit ? ?Subjective:  ? ?  ?Patient ID: Dennis Rosales, male    DOB: [birth year], 26 y.o.   MRN: [id number] ? ?No chief complaint on file. ? ? ?HPI ?Patient is in today for evaluation of a lump on the right side his neck that is changing in size.  He states it has been there for years.  There is no pain or drainage.  He feels it has gradually gotten bigger over the last year or so.   ?No other lumps and no systemic symptoms at all. ?Review of Systems  ?Constitutional:  Negative for fever.  ?HENT:  Negative for congestion, ear pain, sinus pain and sore throat.   ?Respiratory:  Negative for cough.   ? ? ?   ?Objective:  ?  ?BP 132/77 (BP Location: Left Arm, Patient Position: Sitting, Cuff Size: Normal)   Pulse (!) 56   Temp (!) 97.5 ?F (36.4 ?C) (Oral)   Wt 206 lb (93.4 kg)   SpO2 97%   BMI 28.73 kg/m?  ?  ? ?Physical Exam ?Vitals reviewed.  ?Constitutional:   ?   General: He is not in acute distress. ?   Appearance: He is well-developed.  ?HENT:  ?   Head: Normocephalic and atraumatic.  ?   Right Ear: Hearing normal.  ?   Left Ear: Hearing normal.  ?   Nose: Nose normal.  ?Eyes:  ?   General: Lids are normal. No scleral icterus.    ?   Right eye: No discharge.     ?   Left eye: No discharge.  ?   Conjunctiva/sclera: Conjunctivae normal.  ?Cardiovascular:  ?   Rate and Rhythm: Normal rate and regular rhythm.  ?   Heart sounds: Normal heart sounds.  ?Pulmonary:  ?   Effort: Pulmonary effort is normal. No respiratory distress.  ?Skin: ?   General: Skin is warm and dry.  ?   Findings: No lesion or rash.  ?   Comments: There is between a pea-sized marble sized sebaceous cyst/possible lipoma in the right posterior lateral neck.  It is nontender and easily mobile  under the skin.  There is no drainage or sign of infection.  There is no  adenopathy anywhere.  ?Neurological:  ?   General: No focal deficit present.  ?   Mental Status: He is alert and oriented to person, place, and time.  ?Psychiatric:     ?   Mood and Affect: Mood normal.     ?   Speech: Speech normal.     ?   Behavior: Behavior normal.     ?   Thought Content: Thought content normal.     ?   Judgment: Judgment normal.  ? ? ?No results found for any visits on 04/30/21. ? ? ?   ?Assessment & Plan:  ? ?Problem List Items Addressed This Visit   ?None ? ?1. Sebaceous cyst ?No Further intervention or treatment required. ?I will see the patient back later in the year for physical. ? ? ?No orders of the defined types were placed in this encounter. ? ? ?No follow-ups on file. ? ?Dennis Rosales, CMA ? ? ?

## 2021-04-30 ENCOUNTER — Encounter: Payer: Self-pay | Admitting: Family Medicine

## 2021-04-30 ENCOUNTER — Ambulatory Visit: Payer: BC Managed Care – PPO | Admitting: Family Medicine

## 2021-04-30 VITALS — BP 132/77 | HR 56 | Temp 97.5°F | Wt 206.0 lb

## 2021-04-30 DIAGNOSIS — L723 Sebaceous cyst: Secondary | ICD-10-CM

## 2021-08-24 ENCOUNTER — Emergency Department
Admission: EM | Admit: 2021-08-24 | Discharge: 2021-08-24 | Disposition: A | Discharge status: Home / Self Care | Payer: BC Managed Care – PPO | Attending: Emergency Medicine | Admitting: Emergency Medicine

## 2021-08-24 ENCOUNTER — Encounter: Payer: Self-pay | Admitting: Emergency Medicine

## 2021-08-24 ENCOUNTER — Emergency Department: Payer: BC Managed Care – PPO

## 2021-08-24 ENCOUNTER — Other Ambulatory Visit: Payer: Self-pay

## 2021-08-24 DIAGNOSIS — W25XXXA Contact with sharp glass, initial encounter: Secondary | ICD-10-CM | POA: Insufficient documentation

## 2021-08-24 DIAGNOSIS — Z23 Encounter for immunization: Secondary | ICD-10-CM | POA: Diagnosis not present

## 2021-08-24 DIAGNOSIS — S6991XA Unspecified injury of right wrist, hand and finger(s), initial encounter: Secondary | ICD-10-CM | POA: Diagnosis present

## 2021-08-24 DIAGNOSIS — S61411A Laceration without foreign body of right hand, initial encounter: Secondary | ICD-10-CM | POA: Diagnosis not present

## 2021-08-24 MED ORDER — LIDOCAINE HCL (PF) 1 % IJ SOLN
5.0000 mL | Freq: Once | INTRAMUSCULAR | Status: AC
  Administered 2021-08-24: 5 mL via INTRADERMAL
  Filled 2021-08-24: qty 5

## 2021-08-24 MED ORDER — TETANUS-DIPHTH-ACELL PERTUSSIS 5-2.5-18.5 LF-MCG/0.5 IM SUSY
0.5000 mL | PREFILLED_SYRINGE | Freq: Once | INTRAMUSCULAR | Status: AC
  Administered 2021-08-24: 0.5 mL via INTRAMUSCULAR
  Filled 2021-08-24: qty 0.5

## 2021-08-24 MED ORDER — LIDOCAINE HCL (PF) 1 % IJ SOLN
5.0000 mL | Freq: Once | INTRAMUSCULAR | Status: AC
  Administered 2021-08-24: 5 mL
  Filled 2021-08-24: qty 5

## 2021-08-24 NOTE — ED Triage Notes (Signed)
Pt has laceration between the right first and second fingers. Bleeding controlled. Wrap applied in triage.

## 2021-08-24 NOTE — ED Provider Notes (Signed)
[facility] Provider Note    Event Date/Time   First MD Initiated Contact with Patient 08/24/21 1225     (approximate)   History   Laceration   HPI  Dennis Rosales is a 26 y.o. male with no significant past medical history presents to the emergency department with laceration to the right hand.  Patient states he was holding a glass and said it Dayle too hard when it broke and cut his hand.  States he has 2 different lacerations to the right hand.  No numbness or tingling.      Physical Exam   Triage Vital Signs: ED Triage Vitals  Enc Vitals Group     BP 08/24/21 1131 116/68     Pulse Rate 08/24/21 1131 78     Resp 08/24/21 1131 18     Temp 08/24/21 1131 97.6 F (36.4 C)     Temp Source 08/24/21 1131 Oral     SpO2 08/24/21 1131 97 %     Weight 08/24/21 1131 200 lb (90.7 kg)     Height 08/24/21 1131 5\' 11"  (1.803 m)     Head Circumference --      Peak Flow --      Pain Score 08/24/21 1141 6     Pain Loc --      Pain Edu? --      Excl. in GC? --     Most recent vital signs: Vitals:   08/24/21 1131  BP: 116/68  Pulse: 78  Resp: 18  Temp: 97.6 F (36.4 C)  SpO2: 97%     General: Awake, no distress.   CV:  Good peripheral perfusion. regular rate and  rhythm Resp:  Normal effort.  Abd:  No distention.   Other:  Right hand with a laceration in the webspace of the second and third finger, laceration on the palm, no tendon involvement, no foreign body noted to the naked eye, neurovascular is intact, bleeding is controlled at this time   ED Results / Procedures / Treatments   Labs (all labs ordered are listed, but only abnormal results are displayed) Labs Reviewed - No data to display   EKG     RADIOLOGY X-ray of the right hand    PROCEDURES:   .08/22/23Laceration Repair  Date/Time: 08/24/2021 1:43 PM  Performed by: 08/26/2021, PA-C Authorized by: Faythe Ghee, PA-C   Consent:    Consent obtained:  Verbal    Consent given by:  Patient   Risks, benefits, and alternatives were discussed: yes     Risks discussed:  Infection, pain, retained foreign body, tendon damage, poor cosmetic result, need for additional repair, nerve damage, poor wound healing and vascular damage   Alternatives discussed:  No treatment Universal protocol:    Procedure explained and questions answered to patient or proxy's satisfaction: yes     Immediately prior to procedure, a time out was called: yes     Patient identity confirmed:  Verbally with patient Anesthesia:    Anesthesia method:  Local infiltration   Local anesthetic:  Lidocaine 1% w/o epi Laceration details:    Location:  Hand   Hand location:  R palm   Length (cm):  4 Pre-procedure details:    Preparation:  Patient was prepped and draped in usual sterile fashion and imaging obtained to evaluate for foreign bodies Exploration:    Limited defect created (wound extended): no     Hemostasis achieved with:  Direct pressure  Imaging obtained: x-ray     Imaging outcome: foreign body not noted     Wound exploration: wound explored through full range of motion and entire depth of wound visualized     Wound extent: no areolar tissue violation noted, no fascia violation noted, no foreign bodies/material noted, no muscle damage noted, no nerve damage noted, no tendon damage noted, no underlying fracture noted and no vascular damage noted     Contaminated: no   Treatment:    Area cleansed with:  Povidone-iodine and saline   Amount of cleaning:  Standard   Irrigation solution:  Sterile saline   Irrigation method:  Pressure wash, syringe and tap Skin repair:    Repair method:  Sutures   Suture size:  5-0   Suture material:  Nylon   Suture technique:  Simple interrupted   Number of sutures:  14 Approximation:    Approximation:  Close Repair type:    Repair type:  Simple Post-procedure details:    Dressing:  Non-adherent dressing   Procedure completion:   Tolerated well, no immediate complications    MEDICATIONS ORDERED IN ED: Medications  lidocaine (PF) (XYLOCAINE) 1 % injection 5 mL (5 mLs Infiltration Given by Other 08/24/21 1252)  Tdap (BOOSTRIX) injection 0.5 mL (0.5 mLs Intramuscular Given 08/24/21 1328)  lidocaine (PF) (XYLOCAINE) 1 % injection 5 mL (5 mLs Intradermal Given 08/24/21 1327)     IMPRESSION / MDM / ASSESSMENT AND PLAN / ED COURSE  I reviewed the triage vital signs and the nursing notes.                              Differential diagnosis includes, but is not limited to, laceration, foreign body, tendon laceration  Patient's presentation is most consistent with acute complicated illness / injury requiring diagnostic workup.   X-ray of the right hand was independently reviewed and interpreted by me as being negative for foreign body or fracture  See procedure note for laceration repair  I did explain all the findings to the patient.  The area does not appear to be contaminated so he does not need antibiotic.  He is to follow-up with his regular doctor or return emergency department for suture removal in 7 to 10 days.  Return if any sign of infection.  Tdap was updated here in the ED.  Patient was discharged stable condition.      FINAL CLINICAL IMPRESSION(S) / ED DIAGNOSES   Final diagnoses:  Laceration of right hand without foreign body, initial encounter     Rx / DC Orders   ED Discharge Orders     None        Note:  This document was prepared using Dragon voice recognition software and may include unintentional dictation errors.    Faythe Ghee, PA-C 08/24/21 1353    Arnaldo Natal, MD 08/24/21 [phone]

## 2021-08-24 NOTE — ED Notes (Signed)
See triage note  Presents with laceration to right hand  laceration noted to right index and second finger  Bleeding controlled

## 2021-08-24 NOTE — Discharge Instructions (Signed)
Follow with your regular doctor as needed Sutures should be removed in 7 -10 days Return or see your doctor if any sign of infection Keep the area as dry as possible

## 2021-08-25 NOTE — Progress Notes (Unsigned)
Complete physical exam  I,Dennis Rosales,acting as a scribe for Dennis Durie, MD.,have documented all relevant documentation on the behalf of Dennis Durie, MD,as directed by  Dennis Durie, MD while in the presence of Dennis Durie, MD.   Patient: Dennis Rosales   DOB: December 19, 1995   26 y.o. Male  MRN: [id number] Visit Date: 08/26/2021  Today's healthcare provider: [name], MD   Chief Complaint  Patient presents with   Annual Exam   Subjective    Dennis Rosales is a 26 y.o. male who presents today for a complete physical exam.  He reports consuming a general diet. Home exercise routine includes some. He generally feels well. He reports sleeping well. He does not have additional problems to discuss today.  HPI    History reviewed. No pertinent past medical history. History reviewed. No pertinent surgical history. Social History   Socioeconomic History   Marital status: Single    Spouse name: Not on file   Number of children: Not on file   Years of education: Not on file   Highest education level: Not on file  Occupational History   Not on file  Tobacco Use   Smoking status: Never   Smokeless tobacco: Current    Types: Snuff  Vaping Use   Vaping Use: Never used  Substance and Sexual Activity   Alcohol use: No    Alcohol/week: 0.0 standard drinks of alcohol   Drug use: Never   Sexual activity: Not on file  Other Topics Concern   Not on file  Social History Narrative   Not on file   Social Determinants of Health   Financial Resource Strain: Not on file  Food Insecurity: Not on file  Transportation Needs: Not on file  Physical Activity: Not on file  Stress: Not on file  Social Connections: Not on file  Intimate Partner Violence: Not on file   Family Status  Relation Name Status   Mother  Alive   Father  Alive   Family History  Problem Relation Age of Onset   Healthy Mother    Healthy Father    Allergies  Allergen  Reactions   Amoxicillin Other (See Comments)    Unknown-reaction not told to [place]   Azithromycin Other (See Comments)    unknown   Benadryl [Diphenhydramine Hcl (Sleep)] Nausea Only   Cephalosporins Other (See Comments)    UNK    Patient Care Team: Jerrol Banana., MD as PCP - General (Family Medicine)   Medications: Outpatient Medications Prior to Visit  Medication Sig   IBUPROFEN PO Take by mouth.   No facility-administered medications prior to visit.    Review of Systems  All other systems reviewed and are negative.   {Labs  Heme  Chem  Endocrine  Serology  Results Review (optional):23779}  Objective     BP (!) 108/53 (BP Location: Left Arm, Patient Position: Sitting, Cuff Size: Large)   Pulse 62   Resp 14   Ht $R'5\' 11"'fT$  (1.803 m)   Wt 220 lb (99.8 kg)   SpO2 99%   BMI 30.68 kg/m  {Show previous vital signs (optional):23777}   Physical Exam Constitutional:      Appearance: Normal appearance. He is normal weight.  HENT:     Head: Normocephalic and atraumatic.     Right Ear: Tympanic membrane, ear canal and external ear normal.     Left Ear: Tympanic membrane, ear canal and external  ear normal.     Nose: Nose normal.     Mouth/Throat:     Mouth: Mucous membranes are moist.     Pharynx: Oropharynx is clear.  Eyes:     Extraocular Movements: Extraocular movements intact.     Conjunctiva/sclera: Conjunctivae normal.     Pupils: Pupils are equal, round, and reactive to light.  Cardiovascular:     Rate and Rhythm: Normal rate and regular rhythm.     Pulses: Normal pulses.     Heart sounds: Normal heart sounds.  Pulmonary:     Effort: Pulmonary effort is normal.     Breath sounds: Normal breath sounds.  Abdominal:     General: Abdomen is flat. Bowel sounds are normal.     Palpations: Abdomen is soft.  Musculoskeletal:        General: Normal range of motion.     Cervical back: Normal range of motion and neck supple.  Skin:    General: Skin is warm  and dry.  Neurological:     General: No focal deficit present.     Mental Status: He is alert and oriented to person, place, and time. Mental status is at baseline.  Psychiatric:        Mood and Affect: Mood normal.        Behavior: Behavior normal.        Thought Content: Thought content normal.        Judgment: Judgment normal.     ***  Last depression screening scores    08/26/2021    3:48 PM 04/30/2021    8:13 AM  PHQ 2/9 Scores  PHQ - 2 Score 0 0  PHQ- 9 Score 0    Last fall risk screening    08/26/2021    3:48 PM  Fall Risk   Falls in the past year? 0  Number falls in past yr: 0  Injury with Fall? 0  Risk for fall due to : No Fall Risks  Follow up Falls evaluation completed   Last Audit-C alcohol use screening    08/26/2021    3:47 PM  Alcohol Use Disorder Test (AUDIT)  1. How often do you have a drink containing alcohol? 0  2. How many drinks containing alcohol do you have on a typical day when you are drinking? 0  3. How often do you have six or more drinks on one occasion? 0  AUDIT-C Score 0   A score of 3 or more in women, and 4 or more in men indicates increased risk for alcohol abuse, EXCEPT if all of the points are from question 1   Results for orders placed or performed in visit on 08/26/21  POCT urinalysis dipstick  Result Value Ref Range   Color, UA Yellow    Clarity, UA Clear    Glucose, UA Negative Negative   Bilirubin, UA Negative    Ketones, UA Negative    Spec Grav, UA 1.010 1.010 - 1.025   Blood, UA Negative    pH, UA 7.5 5.0 - 8.0   Protein, UA Negative Negative   Urobilinogen, UA 0.2 0.2 or 1.0 E.U./dL   Nitrite, UA Negative    Leukocytes, UA Negative Negative    Assessment & Plan    Routine Health Maintenance and Physical Exam  Exercise Activities and Dietary recommendations  Goals   None     Immunization History  Administered Date(s) Administered   DTaP 01/19/1996, 03/15/1996, 05/30/1996, 11/21/1996, 08/21/2000   HPV  Quadrivalent 01/06/2013   Hepatitis A 08/16/2007, 11/02/2012   Hepatitis B Aug 30, 1995, 01/19/1996, 05/30/1996   HiB (PRP-OMP) 01/19/1996, 03/15/1996, 05/30/1996, 11/21/1996   IPV 01/19/1996, 03/15/1996, 05/30/1996, 08/16/2000   MMR 11/21/1996, 08/21/2000   Meningococcal Conjugate 10/06/2013   Tdap 08/16/2007, 08/24/2021   Varicella 11/21/1996, 08/16/2007    Health Maintenance  Topic Date Due   COVID-19 Vaccine (1) Never done   HIV Screening  Never done   HPV VACCINES (2 - Male 3-dose series) 02/03/2013   Hepatitis C Screening  Never done   INFLUENZA VACCINE  08/05/2021   TETANUS/TDAP  08/25/2031    Discussed health benefits of physical activity, and encouraged him to engage in regular exercise appropriate for his age and condition.  1. Annual physical exam  - CBC with Differential/Platelet - Comprehensive metabolic panel - Lipid Panel With LDL/HDL Ratio - TSH  2. Screening for metabolic disorder  - CBC with Differential/Platelet - Comprehensive metabolic panel - Lipid Panel With LDL/HDL Ratio - TSH  3. Screening for thyroid disorder  - CBC with Differential/Platelet - Comprehensive metabolic panel - Lipid Panel With LDL/HDL Ratio - TSH  4. Screening for lipid disorders  - CBC with Differential/Platelet - Comprehensive metabolic panel - Lipid Panel With LDL/HDL Ratio - TSH  5. Screening for blood or protein in urine  - POCT urinalysis dipstick--Negative   No follow-ups on file.     {provider attestation***:1}   Dennis Durie, MD  [facility] [phone] (phone) [phone] (fax)  [place]

## 2021-08-26 ENCOUNTER — Encounter: Payer: Self-pay | Admitting: Family Medicine

## 2021-08-26 ENCOUNTER — Ambulatory Visit (INDEPENDENT_AMBULATORY_CARE_PROVIDER_SITE_OTHER): Payer: BC Managed Care – PPO | Admitting: Family Medicine

## 2021-08-26 VITALS — BP 108/53 | HR 62 | Resp 14 | Ht 71.0 in | Wt 220.0 lb

## 2021-08-26 DIAGNOSIS — Z1329 Encounter for screening for other suspected endocrine disorder: Secondary | ICD-10-CM

## 2021-08-26 DIAGNOSIS — Z13228 Encounter for screening for other metabolic disorders: Secondary | ICD-10-CM | POA: Diagnosis not present

## 2021-08-26 DIAGNOSIS — Z0000 Encounter for general adult medical examination without abnormal findings: Secondary | ICD-10-CM | POA: Diagnosis not present

## 2021-08-26 DIAGNOSIS — Z1389 Encounter for screening for other disorder: Secondary | ICD-10-CM

## 2021-08-26 DIAGNOSIS — Z13220 Encounter for screening for lipoid disorders: Secondary | ICD-10-CM | POA: Diagnosis not present

## 2021-08-26 LAB — POCT URINALYSIS DIPSTICK
Bilirubin, UA: NEGATIVE
Blood, UA: NEGATIVE
Glucose, UA: NEGATIVE
Ketones, UA: NEGATIVE
Leukocytes, UA: NEGATIVE
Nitrite, UA: NEGATIVE
Protein, UA: NEGATIVE
Spec Grav, UA: 1.01 (ref 1.010–1.025)
Urobilinogen, UA: 0.2 E.U./dL
pH, UA: 7.5 (ref 5.0–8.0)

## 2021-08-27 LAB — CBC WITH DIFFERENTIAL/PLATELET
Basophils Absolute: 0 10*3/uL (ref 0.0–0.2)
Basos: 1 %
EOS (ABSOLUTE): 0.2 10*3/uL (ref 0.0–0.4)
Eos: 3 %
Hematocrit: 40.9 % (ref 37.5–51.0)
Hemoglobin: 14 g/dL (ref 13.0–17.7)
Immature Grans (Abs): 0 10*3/uL (ref 0.0–0.1)
Immature Granulocytes: 0 %
Lymphocytes Absolute: 1.5 10*3/uL (ref 0.7–3.1)
Lymphs: 21 %
MCH: 32 pg (ref 26.6–33.0)
MCHC: 34.2 g/dL (ref 31.5–35.7)
MCV: 94 fL (ref 79–97)
Monocytes Absolute: 0.8 10*3/uL (ref 0.1–0.9)
Monocytes: 11 %
Neutrophils Absolute: 4.6 10*3/uL (ref 1.4–7.0)
Neutrophils: 64 %
Platelets: 248 10*3/uL (ref 150–450)
RBC: 4.37 x10E6/uL (ref 4.14–5.80)
RDW: 12.1 % (ref 11.6–15.4)
WBC: 7.2 10*3/uL (ref 3.4–10.8)

## 2021-08-27 LAB — LIPID PANEL WITH LDL/HDL RATIO
Cholesterol, Total: 176 mg/dL (ref 100–199)
HDL: 44 mg/dL (ref >39)
LDL Chol Calc (NIH): 85 mg/dL (ref 0–99)
LDL/HDL Ratio: 1.9 ratio (ref 0.0–3.6)
Triglycerides: 283 mg/dL — ABNORMAL HIGH (ref 0–149)
VLDL Cholesterol Cal: 47 mg/dL — ABNORMAL HIGH (ref 5–40)

## 2021-08-27 LAB — COMPREHENSIVE METABOLIC PANEL
ALT: 21 IU/L (ref 0–44)
AST: 17 IU/L (ref 0–40)
Albumin/Globulin Ratio: 2 (ref 1.2–2.2)
Albumin: 4.5 g/dL (ref 4.3–5.2)
Alkaline Phosphatase: 77 IU/L (ref 44–121)
BUN/Creatinine Ratio: 11 (ref 9–20)
BUN: 12 mg/dL (ref 6–20)
Bilirubin Total: <0.2 mg/dL (ref 0.0–1.2)
CO2: 25 mmol/L (ref 20–29)
Calcium: 9.8 mg/dL (ref 8.7–10.2)
Chloride: 101 mmol/L (ref 96–106)
Creatinine, Ser: 1.14 mg/dL (ref 0.76–1.27)
Globulin, Total: 2.2 g/dL (ref 1.5–4.5)
Glucose: 94 mg/dL (ref 70–99)
Potassium: 4.1 mmol/L (ref 3.5–5.2)
Sodium: 138 mmol/L (ref 134–144)
Total Protein: 6.7 g/dL (ref 6.0–8.5)
eGFR: 92 mL/min/{1.73_m2} (ref >59)

## 2021-08-27 LAB — TSH: TSH: 1.56 u[IU]/mL (ref 0.450–4.500)

## 2021-08-29 ENCOUNTER — Telehealth: Payer: Self-pay

## 2021-08-29 NOTE — Telephone Encounter (Signed)
PT returned out call  Share provider's note with pt. No questions at this time.  Richard Hulen Shouts., MD  08/29/2021  8:13 AM EDT     Labs in normal range.  Please advise patient.

## 2022-11-03 ENCOUNTER — Ambulatory Visit (INDEPENDENT_AMBULATORY_CARE_PROVIDER_SITE_OTHER): Payer: BC Managed Care – PPO | Admitting: Family Medicine

## 2022-11-03 ENCOUNTER — Encounter: Payer: Self-pay | Admitting: Family Medicine

## 2022-11-03 VITALS — BP 119/64 | HR 62 | Ht 71.0 in | Wt 223.0 lb

## 2022-11-03 DIAGNOSIS — Z0000 Encounter for general adult medical examination without abnormal findings: Secondary | ICD-10-CM | POA: Insufficient documentation

## 2022-11-03 DIAGNOSIS — Z0001 Encounter for general adult medical examination with abnormal findings: Secondary | ICD-10-CM

## 2022-11-03 DIAGNOSIS — Z2821 Immunization not carried out because of patient refusal: Secondary | ICD-10-CM | POA: Diagnosis not present

## 2022-11-03 DIAGNOSIS — R221 Localized swelling, mass and lump, neck: Secondary | ICD-10-CM | POA: Diagnosis not present

## 2022-11-03 DIAGNOSIS — E781 Pure hyperglyceridemia: Secondary | ICD-10-CM | POA: Diagnosis not present

## 2022-11-03 DIAGNOSIS — Z13228 Encounter for screening for other metabolic disorders: Secondary | ICD-10-CM

## 2022-11-03 NOTE — Assessment & Plan Note (Signed)
Triglycerides 283 on 08/26/2021 We will recheck today.

## 2022-11-03 NOTE — Assessment & Plan Note (Addendum)
Physical exam overall unremarkable except as noted above. Routine lab work ordered as noted. Filled out patient's biometric screening form today.

## 2022-11-03 NOTE — Progress Notes (Signed)
Complete physical exam   Patient: Dennis Rosales   DOB: February 14, 1995   26 y.o. Male  MRN: [id number] Visit Date: 11/03/2022  Today's healthcare provider: Sherlyn Hay, DO   Chief Complaint  Patient presents with   biotmetric screening   Subjective    Dennis Rosales is a 27 y.o. male who presents today for a complete physical exam.  He reports consuming a general diet. The patient has a physically strenuous job, but has no regular exercise apart from work.  He generally feels well. He reports sleeping well. He does not have additional problems to discuss today.  HPI HPI   Employee Paperwork need to signature. Last edited by Shelly Bombard, CMA on 11/03/2022  4:04 PM.      Last annual exam: 08/26/2021 Patient here for biometric screening.   HIV/hepatitis C screening? declined Influenza vaccine? No  COVID booster? No  HPV vaccines?  Had 1 quadrivalent vaccine on 01/06/2013; declined    History reviewed. No pertinent past medical history. History reviewed. No pertinent surgical history. Social History   Socioeconomic History   Marital status: Single    Spouse name: Not on file   Number of children: Not on file   Years of education: Not on file   Highest education level: Not on file  Occupational History   Not on file  Tobacco Use   Smoking status: Never   Smokeless tobacco: Current    Types: Snuff  Vaping Use   Vaping status: Never Used  Substance and Sexual Activity   Alcohol use: No    Alcohol/week: 0.0 standard drinks of alcohol   Drug use: Never   Sexual activity: Not on file  Other Topics Concern   Not on file  Social History Narrative   Not on file   Social Determinants of Health   Financial Resource Strain: Not on file  Food Insecurity: Not on file  Transportation Needs: Not on file  Physical Activity: Not on file  Stress: Not on file  Social Connections: Not on file  Intimate Partner Violence: Not on file   Family Status  Relation Name  Status   Mother  Alive   Father  Alive  No partnership data on file   Family History  Problem Relation Age of Onset   Healthy Mother    Healthy Father    Allergies  Allergen Reactions   Amoxicillin Other (See Comments)    Unknown-reaction not told to [place]   Azithromycin Other (See Comments)    unknown   Benadryl [Diphenhydramine Hcl (Sleep)] Nausea Only   Cephalosporins Other (See Comments)    UNK    Patient Care Team: Sherlyn Hay, DO as PCP - General (Family Medicine)   Medications: Outpatient Medications Prior to Visit  Medication Sig   IBUPROFEN PO Take by mouth.   No facility-administered medications prior to visit.    Review of Systems  Constitutional:  Negative for appetite change, chills, fatigue and fever.  HENT:  Negative for congestion, ear pain, hearing loss, nosebleeds and trouble swallowing.   Eyes:  Negative for pain and visual disturbance.  Respiratory:  Negative for cough, chest tightness and shortness of breath.   Cardiovascular:  Negative for chest pain, palpitations and leg swelling.  Gastrointestinal:  Negative for abdominal pain, blood in stool, constipation, diarrhea, nausea and vomiting.  Endocrine: Negative for polydipsia, polyphagia and polyuria.  Genitourinary:  Negative for dysuria and flank pain.  Musculoskeletal:  Negative for arthralgias,  back pain, joint swelling, myalgias and neck stiffness.  Skin:  Negative for color change, rash and wound.  Neurological:  Negative for dizziness, tremors, seizures, speech difficulty, weakness, light-headedness and headaches.  Psychiatric/Behavioral:  Negative for behavioral problems, confusion, decreased concentration, dysphoric mood and sleep disturbance. The patient is not nervous/anxious.   All other systems reviewed and are negative.     Objective    BP 119/64 (BP Location: Right Arm, Patient Position: Sitting, Cuff Size: Normal)   Pulse 62   Ht 5\' 11"  (1.803 m)   Wt 223 lb (101.2 kg)   SpO2  98%   BMI 31.10 kg/m    Physical Exam Vitals and nursing note reviewed.  Constitutional:      General: He is awake.     Appearance: Normal appearance.  HENT:     Head: Normocephalic and atraumatic.     Right Ear: Tympanic membrane, ear canal and external ear normal.     Left Ear: Tympanic membrane, ear canal and external ear normal.     Nose: Nose normal.     Mouth/Throat:     Mouth: Mucous membranes are moist.     Pharynx: Oropharynx is clear. No oropharyngeal exudate or posterior oropharyngeal erythema.  Eyes:     General: No scleral icterus.    Extraocular Movements: Extraocular movements intact.     Conjunctiva/sclera: Conjunctivae normal.     Pupils: Pupils are equal, round, and reactive to light.  Neck:     Thyroid: No thyromegaly or thyroid tenderness.  Cardiovascular:     Rate and Rhythm: Normal rate and regular rhythm.     Pulses: Normal pulses.     Heart sounds: Normal heart sounds.  Pulmonary:     Effort: Pulmonary effort is normal. No tachypnea, bradypnea or respiratory distress.     Breath sounds: Normal breath sounds. No stridor. No wheezing, rhonchi or rales.  Abdominal:     General: Bowel sounds are normal. There is no distension.     Palpations: Abdomen is soft. There is no mass.     Tenderness: There is no abdominal tenderness. There is no guarding.     Hernia: No hernia is present.  Musculoskeletal:     Cervical back: Normal range of motion and neck supple.     Right lower leg: No edema.     Left lower leg: No edema.  Lymphadenopathy:     Cervical: No cervical adenopathy.  Skin:    General: Skin is warm and dry.          Comments: Small 1 cm, mobile, nontender lump noted  Neurological:     Mental Status: He is alert and oriented to person, place, and time. Mental status is at baseline.  Psychiatric:        Mood and Affect: Mood normal.        Behavior: Behavior normal.      Last depression screening scores    11/03/2022    3:55 PM  08/26/2021    3:48 PM 04/30/2021    8:13 AM  PHQ 2/9 Scores  PHQ - 2 Score 0 0 0  PHQ- 9 Score 0 0    Last fall risk screening    11/03/2022    3:54 PM  Fall Risk   Falls in the past year? 0  Number falls in past yr: 0  Injury with Fall? 0   Last Audit-C alcohol use screening    11/03/2022    4:10 PM  Alcohol Use Disorder  Test (AUDIT)  1. How often do you have a drink containing alcohol? 2  2. How many drinks containing alcohol do you have on a typical day when you are drinking? 0  3. How often do you have six or more drinks on one occasion? 0  AUDIT-C Score 2   A score of 3 or more in women, and 4 or more in men indicates increased risk for alcohol abuse, EXCEPT if all of the points are from question 1   No results found for any visits on 11/03/22.  Assessment & Plan    Routine Health Maintenance and Physical Exam  Exercise Activities and Dietary recommendations  Goals   None     Immunization History  Administered Date(s) Administered   DTaP 01/19/1996, 03/15/1996, 05/30/1996, 11/21/1996, 08/21/2000   HIB (PRP-OMP) 01/19/1996, 03/15/1996, 05/30/1996, 11/21/1996   HPV Quadrivalent 01/06/2013   Hepatitis A 08/16/2007, 11/02/2012   Hepatitis B 1995/07/15, 01/19/1996, 05/30/1996   IPV 01/19/1996, 03/15/1996, 05/30/1996, 08/16/2000   MMR 11/21/1996, 08/21/2000   Meningococcal Conjugate 10/06/2013   Tdap 08/16/2007, 08/24/2021   Varicella 11/21/1996, 08/16/2007    Health Maintenance  Topic Date Due   COVID-19 Vaccine (1 - 2023-24 season) 11/19/2022 (Originally 09/06/2022)   INFLUENZA VACCINE  04/05/2023 (Originally 08/06/2022)   HPV VACCINES (2 - Male 3-dose series) 11/03/2023 (Originally 02/03/2013)   Hepatitis C Screening  11/03/2023 (Originally 11/21/2013)   HIV Screening  11/03/2023 (Originally 11/22/2010)   DTaP/Tdap/Td (8 - Td or Tdap) 08/25/2031    Discussed health benefits of physical activity, and encouraged him to engage in regular exercise appropriate for  his age and condition.   Annual physical exam Assessment & Plan: Physical exam overall unremarkable except as noted above. Routine lab work ordered as noted. Filled out patient's biometric screening form today.  Orders: -     Comprehensive metabolic panel  Hypertriglyceridemia Assessment & Plan: Triglycerides 283 on 08/26/2021 We will recheck today.  Orders: -     Lipid panel  Screening for metabolic disorder -     Comprehensive metabolic panel  Immunization declined  Lump in neck Assessment & Plan: Present and unchanged for over a year. Patient does not want to pursue surgical removal as it is not bothering him.     Return in about 1 year (around 11/03/2023) for CPE.     I discussed the assessment and treatment plan with the patient  The patient was provided an opportunity to ask questions and all were answered. The patient agreed with the plan and demonstrated an understanding of the instructions.   The patient was advised to call back or seek an in-person evaluation if the symptoms worsen or if the condition fails to improve as anticipated.    Sherlyn Hay, DO  [facility] [phone] (phone) [phone] (fax)  [facility]

## 2022-11-03 NOTE — Assessment & Plan Note (Signed)
Present and unchanged for over a year. Patient does not want to pursue surgical removal as it is not bothering him.

## 2022-11-04 LAB — COMPREHENSIVE METABOLIC PANEL
ALT: 24 [IU]/L (ref 0–44)
AST: 19 [IU]/L (ref 0–40)
Albumin: 4.6 g/dL (ref 4.3–5.2)
Alkaline Phosphatase: 77 [IU]/L (ref 44–121)
BUN/Creatinine Ratio: 9 (ref 9–20)
BUN: 11 mg/dL (ref 6–20)
Bilirubin Total: 0.2 mg/dL (ref 0.0–1.2)
CO2: 24 mmol/L (ref 20–29)
Calcium: 9.7 mg/dL (ref 8.7–10.2)
Chloride: 103 mmol/L (ref 96–106)
Creatinine, Ser: 1.16 mg/dL (ref 0.76–1.27)
Globulin, Total: 2.3 g/dL (ref 1.5–4.5)
Glucose: 89 mg/dL (ref 70–99)
Potassium: 4.4 mmol/L (ref 3.5–5.2)
Sodium: 141 mmol/L (ref 134–144)
Total Protein: 6.9 g/dL (ref 6.0–8.5)
eGFR: 89 mL/min/{1.73_m2} (ref >59)

## 2022-11-04 LAB — LIPID PANEL
Chol/HDL Ratio: 5.3 ratio — ABNORMAL HIGH (ref 0.0–5.0)
Cholesterol, Total: 234 mg/dL — ABNORMAL HIGH (ref 100–199)
HDL: 44 mg/dL (ref >39)
LDL Chol Calc (NIH): 146 mg/dL — ABNORMAL HIGH (ref 0–99)
Triglycerides: 240 mg/dL — ABNORMAL HIGH (ref 0–149)
VLDL Cholesterol Cal: 44 mg/dL — ABNORMAL HIGH (ref 5–40)

## 2023-08-25 ENCOUNTER — Encounter (HOSPITAL_COMMUNITY): Payer: Self-pay

## 2023-08-25 ENCOUNTER — Emergency Department
Admission: EM | Admit: 2023-08-25 | Discharge: 2023-08-25 | Disposition: A | Discharge status: Other Acute Inpatient Hospital | Attending: Emergency Medicine | Admitting: Emergency Medicine

## 2023-08-25 ENCOUNTER — Observation Stay (HOSPITAL_COMMUNITY)

## 2023-08-25 ENCOUNTER — Encounter: Payer: Self-pay | Admitting: Emergency Medicine

## 2023-08-25 ENCOUNTER — Observation Stay (HOSPITAL_BASED_OUTPATIENT_CLINIC_OR_DEPARTMENT_OTHER)

## 2023-08-25 ENCOUNTER — Inpatient Hospital Stay (HOSPITAL_COMMUNITY)

## 2023-08-25 ENCOUNTER — Inpatient Hospital Stay

## 2023-08-25 ENCOUNTER — Other Ambulatory Visit: Payer: Self-pay

## 2023-08-25 ENCOUNTER — Emergency Department

## 2023-08-25 ENCOUNTER — Observation Stay (HOSPITAL_COMMUNITY)
Admission: EM | Admit: 2023-08-25 | Discharge: 2023-08-27 | Disposition: A | Discharge status: Home / Self Care | Source: Other Acute Inpatient Hospital | Attending: General Surgery | Admitting: General Surgery

## 2023-08-25 DIAGNOSIS — S36114A Minor laceration of liver, initial encounter: Secondary | ICD-10-CM | POA: Insufficient documentation

## 2023-08-25 DIAGNOSIS — S80211A Abrasion, right knee, initial encounter: Secondary | ICD-10-CM | POA: Diagnosis not present

## 2023-08-25 DIAGNOSIS — S2242XA Multiple fractures of ribs, left side, initial encounter for closed fracture: Principal | ICD-10-CM | POA: Insufficient documentation

## 2023-08-25 DIAGNOSIS — T1490XA Injury, unspecified, initial encounter: Principal | ICD-10-CM | POA: Diagnosis present

## 2023-08-25 DIAGNOSIS — S270XXA Traumatic pneumothorax, initial encounter: Secondary | ICD-10-CM | POA: Diagnosis not present

## 2023-08-25 DIAGNOSIS — F17220 Nicotine dependence, chewing tobacco, uncomplicated: Secondary | ICD-10-CM | POA: Insufficient documentation

## 2023-08-25 DIAGNOSIS — S36113A Laceration of liver, unspecified degree, initial encounter: Secondary | ICD-10-CM | POA: Diagnosis not present

## 2023-08-25 DIAGNOSIS — S27321A Contusion of lung, unilateral, initial encounter: Secondary | ICD-10-CM | POA: Insufficient documentation

## 2023-08-25 DIAGNOSIS — R7989 Other specified abnormal findings of blood chemistry: Secondary | ICD-10-CM | POA: Diagnosis not present

## 2023-08-25 DIAGNOSIS — S0232XA Fracture of orbital floor, left side, initial encounter for closed fracture: Secondary | ICD-10-CM | POA: Diagnosis not present

## 2023-08-25 DIAGNOSIS — S2249XA Multiple fractures of ribs, unspecified side, initial encounter for closed fracture: Secondary | ICD-10-CM | POA: Diagnosis present

## 2023-08-25 DIAGNOSIS — R079 Chest pain, unspecified: Secondary | ICD-10-CM | POA: Diagnosis not present

## 2023-08-25 DIAGNOSIS — Z743 Need for continuous supervision: Secondary | ICD-10-CM | POA: Diagnosis not present

## 2023-08-25 DIAGNOSIS — J939 Pneumothorax, unspecified: Secondary | ICD-10-CM

## 2023-08-25 DIAGNOSIS — S50312A Abrasion of left elbow, initial encounter: Secondary | ICD-10-CM | POA: Diagnosis not present

## 2023-08-25 DIAGNOSIS — R002 Palpitations: Principal | ICD-10-CM

## 2023-08-25 DIAGNOSIS — S20309A Unspecified superficial injuries of unspecified front wall of thorax, initial encounter: Secondary | ICD-10-CM | POA: Diagnosis present

## 2023-08-25 DIAGNOSIS — I959 Hypotension, unspecified: Secondary | ICD-10-CM | POA: Diagnosis not present

## 2023-08-25 LAB — URINALYSIS, ROUTINE W REFLEX MICROSCOPIC
Bilirubin Urine: NEGATIVE
Glucose, UA: NEGATIVE mg/dL
Hgb urine dipstick: NEGATIVE
Ketones, ur: NEGATIVE mg/dL
Leukocytes,Ua: NEGATIVE
Nitrite: NEGATIVE
Protein, ur: NEGATIVE mg/dL
Specific Gravity, Urine: 1.023 (ref 1.005–1.030)
pH: 5 (ref 5.0–8.0)

## 2023-08-25 LAB — URINE DRUG SCREEN, QUALITATIVE (ARMC ONLY)
Amphetamines, Ur Screen: NOT DETECTED
Barbiturates, Ur Screen: NOT DETECTED
Benzodiazepine, Ur Scrn: NOT DETECTED
Cannabinoid 50 Ng, Ur ~~LOC~~: NOT DETECTED
Cocaine Metabolite,Ur ~~LOC~~: NOT DETECTED
MDMA (Ecstasy)Ur Screen: NOT DETECTED
Methadone Scn, Ur: NOT DETECTED
Opiate, Ur Screen: NOT DETECTED
Phencyclidine (PCP) Ur S: NOT DETECTED
Tricyclic, Ur Screen: NOT DETECTED

## 2023-08-25 LAB — COMPREHENSIVE METABOLIC PANEL WITH GFR
ALT: 35 U/L (ref 0–44)
AST: 35 U/L (ref 15–41)
Albumin: 4.3 g/dL (ref 3.5–5.0)
Alkaline Phosphatase: 54 U/L (ref 38–126)
Anion gap: 12 (ref 5–15)
BUN: 11 mg/dL (ref 6–20)
CO2: 24 mmol/L (ref 22–32)
Calcium: 9.4 mg/dL (ref 8.9–10.3)
Chloride: 105 mmol/L (ref 98–111)
Creatinine, Ser: 1.12 mg/dL (ref 0.61–1.24)
GFR, Estimated: >60 mL/min (ref >60)
Glucose, Bld: 135 mg/dL — ABNORMAL HIGH (ref 70–99)
Potassium: 3.6 mmol/L (ref 3.5–5.1)
Sodium: 141 mmol/L (ref 135–145)
Total Bilirubin: 0.6 mg/dL (ref 0.0–1.2)
Total Protein: 7.3 g/dL (ref 6.5–8.1)

## 2023-08-25 LAB — CBC WITH DIFFERENTIAL/PLATELET
Abs Immature Granulocytes: 0.07 K/uL (ref 0.00–0.07)
Basophils Absolute: 0.1 K/uL (ref 0.0–0.1)
Basophils Relative: 1 %
Eosinophils Absolute: 0.3 K/uL (ref 0.0–0.5)
Eosinophils Relative: 4 %
HCT: 45 % (ref 39.0–52.0)
Hemoglobin: 15.3 g/dL (ref 13.0–17.0)
Immature Granulocytes: 1 %
Lymphocytes Relative: 23 %
Lymphs Abs: 1.9 K/uL (ref 0.7–4.0)
MCH: 31.1 pg (ref 26.0–34.0)
MCHC: 34 g/dL (ref 30.0–36.0)
MCV: 91.5 fL (ref 80.0–100.0)
Monocytes Absolute: 0.6 K/uL (ref 0.1–1.0)
Monocytes Relative: 7 %
Neutro Abs: 5.2 K/uL (ref 1.7–7.7)
Neutrophils Relative %: 64 %
Platelets: 306 K/uL (ref 150–400)
RBC: 4.92 MIL/uL (ref 4.22–5.81)
RDW: 11.8 % (ref 11.5–15.5)
WBC: 8.1 K/uL (ref 4.0–10.5)
nRBC: 0 % (ref 0.0–0.2)

## 2023-08-25 LAB — BASIC METABOLIC PANEL WITH GFR
Anion gap: 7 (ref 5–15)
BUN: 7 mg/dL (ref 6–20)
CO2: 25 mmol/L (ref 22–32)
Calcium: 8.6 mg/dL — ABNORMAL LOW (ref 8.9–10.3)
Chloride: 104 mmol/L (ref 98–111)
Creatinine, Ser: 1.03 mg/dL (ref 0.61–1.24)
GFR, Estimated: >60 mL/min (ref >60)
Glucose, Bld: 127 mg/dL — ABNORMAL HIGH (ref 70–99)
Potassium: 3.6 mmol/L (ref 3.5–5.1)
Sodium: 136 mmol/L (ref 135–145)

## 2023-08-25 LAB — ETHANOL: Alcohol, Ethyl (B): <15 mg/dL (ref <15)

## 2023-08-25 LAB — TROPONIN I (HIGH SENSITIVITY)
Troponin I (High Sensitivity): 18 ng/L — ABNORMAL HIGH (ref <18)
Troponin I (High Sensitivity): 91 ng/L — ABNORMAL HIGH (ref <18)
Troponin I (High Sensitivity): 1041 ng/L (ref <18)
Troponin I (High Sensitivity): 1417 ng/L (ref <18)

## 2023-08-25 LAB — CBC
HCT: 37.1 % — ABNORMAL LOW (ref 39.0–52.0)
Hemoglobin: 12.6 g/dL — ABNORMAL LOW (ref 13.0–17.0)
MCH: 31.7 pg (ref 26.0–34.0)
MCHC: 34 g/dL (ref 30.0–36.0)
MCV: 93.2 fL (ref 80.0–100.0)
Platelets: 206 K/uL (ref 150–400)
RBC: 3.98 MIL/uL — ABNORMAL LOW (ref 4.22–5.81)
RDW: 11.9 % (ref 11.5–15.5)
WBC: 11.2 K/uL — ABNORMAL HIGH (ref 4.0–10.5)
nRBC: 0 % (ref 0.0–0.2)

## 2023-08-25 LAB — ECHOCARDIOGRAM COMPLETE
AR max vel: 3.42 cm2
AV Peak grad: 5.7 mmHg
Ao pk vel: 1.19 m/s
Area-P 1/2: 3.91 cm2
Height: 71 in
S' Lateral: 2.2 cm
Weight: 3680 [oz_av]

## 2023-08-25 LAB — PHOSPHORUS: Phosphorus: 2.3 mg/dL — ABNORMAL LOW (ref 2.5–4.6)

## 2023-08-25 LAB — MAGNESIUM: Magnesium: 1.7 mg/dL (ref 1.7–2.4)

## 2023-08-25 MED ORDER — HYDROMORPHONE HCL 1 MG/ML IJ SOLN: 1.0000 mg | Freq: Once | INTRAMUSCULAR | Status: DC

## 2023-08-25 MED ORDER — HYDROMORPHONE HCL 1 MG/ML IJ SOLN
0.5000 mg | Freq: Once | INTRAMUSCULAR | Status: AC
  Administered 2023-08-25: 0.5 mg via INTRAVENOUS
  Filled 2023-08-25: qty 0.5

## 2023-08-25 MED ORDER — TETANUS-DIPHTH-ACELL PERTUSSIS 5-2.5-18.5 LF-MCG/0.5 IM SUSY
0.5000 mL | PREFILLED_SYRINGE | Freq: Once | INTRAMUSCULAR | Status: AC
  Administered 2023-08-25: 0.5 mL via INTRAMUSCULAR
  Filled 2023-08-25: qty 0.5

## 2023-08-25 MED ORDER — METHOCARBAMOL 1000 MG/10ML IJ SOLN
500.0000 mg | Freq: Three times a day (TID) | INTRAMUSCULAR | Status: DC
  Filled 2023-08-25 (×2): qty 5

## 2023-08-25 MED ORDER — HYDRALAZINE HCL 20 MG/ML IJ SOLN: 10.0000 mg | INTRAMUSCULAR | Status: DC | PRN

## 2023-08-25 MED ORDER — ONDANSETRON 4 MG PO TBDP
4.0000 mg | ORAL_TABLET | Freq: Four times a day (QID) | ORAL | Status: DC | PRN
Start: 2023-08-25 — End: 2023-08-27

## 2023-08-25 MED ORDER — ONDANSETRON HCL 4 MG/2ML IJ SOLN: 4.0000 mg | Freq: Four times a day (QID) | INTRAMUSCULAR | Status: DC | PRN

## 2023-08-25 MED ORDER — ACETAMINOPHEN 500 MG PO TABS
1000.0000 mg | ORAL_TABLET | Freq: Four times a day (QID) | ORAL | Status: DC
  Administered 2023-08-25: 1000 mg via ORAL
  Filled 2023-08-25: qty 2

## 2023-08-25 MED ORDER — GABAPENTIN 300 MG PO CAPS
300.0000 mg | ORAL_CAPSULE | Freq: Three times a day (TID) | ORAL | Status: DC
  Administered 2023-08-25 – 2023-08-27 (×6): 300 mg via ORAL
  Filled 2023-08-25 (×6): qty 1

## 2023-08-25 MED ORDER — POLYETHYLENE GLYCOL 3350 17 G PO PACK: 17.0000 g | PACK | Freq: Every day | ORAL | Status: DC | PRN

## 2023-08-25 MED ORDER — SODIUM CHLORIDE 0.9 % IV SOLN: INTRAVENOUS | Status: DC

## 2023-08-25 MED ORDER — ACETAMINOPHEN 500 MG PO TABS
1000.0000 mg | ORAL_TABLET | Freq: Four times a day (QID) | ORAL | Status: DC
  Administered 2023-08-25 – 2023-08-27 (×7): 1000 mg via ORAL
  Filled 2023-08-25 (×7): qty 2

## 2023-08-25 MED ORDER — METOPROLOL TARTRATE 5 MG/5ML IV SOLN: 5.0000 mg | Freq: Four times a day (QID) | INTRAVENOUS | Status: DC | PRN

## 2023-08-25 MED ORDER — HYDROMORPHONE HCL 1 MG/ML IJ SOLN
1.0000 mg | Freq: Once | INTRAMUSCULAR | Status: AC
  Administered 2023-08-25: 1 mg via INTRAVENOUS
  Filled 2023-08-25 (×2): qty 1

## 2023-08-25 MED ORDER — POLYETHYLENE GLYCOL 3350 17 G PO PACK
17.0000 g | PACK | Freq: Every day | ORAL | Status: DC | PRN
Start: 2023-08-25 — End: 2023-08-27

## 2023-08-25 MED ORDER — SODIUM CHLORIDE 0.9 % IV BOLUS
1000.0000 mL | Freq: Once | INTRAVENOUS | Status: AC
  Administered 2023-08-25: 1000 mL via INTRAVENOUS

## 2023-08-25 MED ORDER — DOCUSATE SODIUM 100 MG PO CAPS: 100.0000 mg | ORAL_CAPSULE | Freq: Two times a day (BID) | ORAL | Status: DC

## 2023-08-25 MED ORDER — HYDROMORPHONE HCL 1 MG/ML IJ SOLN: 1.0000 mg | INTRAMUSCULAR | Status: DC | PRN

## 2023-08-25 MED ORDER — IOHEXOL 350 MG/ML SOLN
75.0000 mL | Freq: Once | INTRAVENOUS | Status: AC | PRN
  Administered 2023-08-25: 75 mL via INTRAVENOUS

## 2023-08-25 MED ORDER — IOHEXOL 300 MG/ML  SOLN
100.0000 mL | Freq: Once | INTRAMUSCULAR | Status: AC | PRN
  Administered 2023-08-25: 100 mL via INTRAVENOUS

## 2023-08-25 MED ORDER — BACITRACIN ZINC 500 UNIT/GM EX OINT
TOPICAL_OINTMENT | Freq: Two times a day (BID) | CUTANEOUS | Status: DC
  Administered 2023-08-25 – 2023-08-27 (×4): 31.5 via TOPICAL
  Filled 2023-08-25: qty 28.4

## 2023-08-25 MED ORDER — OXYCODONE HCL 5 MG PO TABS
5.0000 mg | ORAL_TABLET | ORAL | Status: DC | PRN
  Administered 2023-08-25 – 2023-08-27 (×7): 5 mg via ORAL
  Filled 2023-08-25 (×7): qty 1

## 2023-08-25 MED ORDER — METHOCARBAMOL 1000 MG/10ML IJ SOLN: 500.0000 mg | Freq: Three times a day (TID) | INTRAMUSCULAR | Status: DC

## 2023-08-25 MED ORDER — METHOCARBAMOL 500 MG PO TABS
500.0000 mg | ORAL_TABLET | Freq: Three times a day (TID) | ORAL | Status: DC
  Administered 2023-08-25 – 2023-08-27 (×6): 500 mg via ORAL
  Filled 2023-08-25 (×6): qty 1

## 2023-08-25 MED ORDER — DOCUSATE SODIUM 100 MG PO CAPS
100.0000 mg | ORAL_CAPSULE | Freq: Two times a day (BID) | ORAL | Status: DC
  Administered 2023-08-26 – 2023-08-27 (×2): 100 mg via ORAL
  Filled 2023-08-25 (×3): qty 1

## 2023-08-25 MED ORDER — HYDROMORPHONE HCL 1 MG/ML IJ SOLN
1.0000 mg | INTRAMUSCULAR | Status: AC | PRN
  Administered 2023-08-25 (×2): 1 mg via INTRAVENOUS
  Filled 2023-08-25 (×2): qty 1

## 2023-08-25 MED ORDER — IOHEXOL 350 MG/ML SOLN: 75.0000 mL | Freq: Once | INTRAVENOUS | Status: DC | PRN

## 2023-08-25 MED ORDER — ONDANSETRON HCL 4 MG/2ML IJ SOLN
4.0000 mg | Freq: Once | INTRAMUSCULAR | Status: AC
  Administered 2023-08-25: 4 mg via INTRAVENOUS
  Filled 2023-08-25: qty 2

## 2023-08-25 MED ORDER — ONDANSETRON 4 MG PO TBDP: 4.0000 mg | ORAL_TABLET | Freq: Four times a day (QID) | ORAL | Status: DC | PRN

## 2023-08-25 MED ORDER — METHOCARBAMOL 500 MG PO TABS
500.0000 mg | ORAL_TABLET | Freq: Three times a day (TID) | ORAL | Status: DC
  Administered 2023-08-25: 500 mg via ORAL
  Filled 2023-08-25: qty 1

## 2023-08-25 MED ORDER — ENOXAPARIN SODIUM 30 MG/0.3ML IJ SOSY
30.0000 mg | PREFILLED_SYRINGE | Freq: Two times a day (BID) | INTRAMUSCULAR | Status: DC
  Administered 2023-08-26 (×2): 30 mg via SUBCUTANEOUS
  Filled 2023-08-25 (×3): qty 0.3
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
## 2023-08-25 NOTE — ED Notes (Signed)
 Consent to transfer signature obtained- copy made to send with transfer packet.

## 2023-08-25 NOTE — ED Notes (Signed)
 Dr Ernest notified of trop 85. No new orders at this time.

## 2023-08-25 NOTE — ED Notes (Signed)
 RN clean and dressed right knee and left forearm/elbow abrasions.

## 2023-08-25 NOTE — ED Notes (Signed)
 [place] (Pt. Girlfriend):  [phone]
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
## 2023-08-25 NOTE — ED Notes (Addendum)
 Pt c/o centralized chest pain. Dr Ernest notified

## 2023-08-25 NOTE — ED Notes (Signed)
 Called  [facility]  spoke with Debby  pt  still  waiting on  bed

## 2023-08-25 NOTE — ED Notes (Signed)
 Pt continuing to complaint of chest pain, Dr Ernest notified. CT called for ETA per Dr Ernest

## 2023-08-25 NOTE — ED Notes (Signed)
 Placed on NRB per order

## 2023-08-25 NOTE — Progress Notes (Signed)
 Echocardiogram 2D Echocardiogram has been performed.  Dennis Rosales 08/25/2023, 5:30 PM

## 2023-08-25 NOTE — Plan of Care (Signed)
  Problem: Education: Goal: Knowledge of General Education information will improve Description Including pain rating scale, medication(s)/side effects and non-pharmacologic comfort measures Outcome: Progressing   Problem: Clinical Measurements: Goal: Ability to maintain clinical measurements within normal limits will improve Outcome: Progressing   Problem: Activity: Goal: Risk for activity intolerance will decrease Outcome: Progressing   Problem: Skin Integrity: Goal: Risk for impaired skin integrity will decrease Outcome: Progressing   
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
## 2023-08-25 NOTE — ED Notes (Signed)
EMTALA reviewed. 

## 2023-08-25 NOTE — ED Notes (Signed)
 Gave pt UA cup, pt states he doesn't need to use the bathroom and said he will give a urine sample as soon as he goes

## 2023-08-26 DIAGNOSIS — R7989 Other specified abnormal findings of blood chemistry: Secondary | ICD-10-CM | POA: Diagnosis not present

## 2023-08-26 DIAGNOSIS — S270XXA Traumatic pneumothorax, initial encounter: Secondary | ICD-10-CM | POA: Diagnosis not present

## 2023-08-26 DIAGNOSIS — S27321A Contusion of lung, unilateral, initial encounter: Secondary | ICD-10-CM | POA: Diagnosis not present

## 2023-08-26 DIAGNOSIS — S2242XA Multiple fractures of ribs, left side, initial encounter for closed fracture: Secondary | ICD-10-CM | POA: Diagnosis not present

## 2023-08-26 DIAGNOSIS — S36114A Minor laceration of liver, initial encounter: Secondary | ICD-10-CM | POA: Diagnosis not present

## 2023-08-26 DIAGNOSIS — S2691XA Contusion of heart, unspecified with or without hemopericardium, initial encounter: Secondary | ICD-10-CM | POA: Diagnosis not present

## 2023-08-26 LAB — PHOSPHORUS: Phosphorus: 3.1 mg/dL (ref 2.5–4.6)

## 2023-08-26 LAB — CBC
HCT: 40.3 % (ref 39.0–52.0)
Hemoglobin: 13.6 g/dL (ref 13.0–17.0)
MCH: 31.4 pg (ref 26.0–34.0)
MCHC: 33.7 g/dL (ref 30.0–36.0)
MCV: 93.1 fL (ref 80.0–100.0)
Platelets: 200 K/uL (ref 150–400)
RBC: 4.33 MIL/uL (ref 4.22–5.81)
RDW: 11.9 % (ref 11.5–15.5)
WBC: 10.2 K/uL (ref 4.0–10.5)
nRBC: 0 % (ref 0.0–0.2)

## 2023-08-26 LAB — BASIC METABOLIC PANEL WITH GFR
Anion gap: 6 (ref 5–15)
BUN: 5 mg/dL — ABNORMAL LOW (ref 6–20)
CO2: 25 mmol/L (ref 22–32)
Calcium: 8.7 mg/dL — ABNORMAL LOW (ref 8.9–10.3)
Chloride: 104 mmol/L (ref 98–111)
Creatinine, Ser: 1.04 mg/dL (ref 0.61–1.24)
GFR, Estimated: >60 mL/min (ref >60)
Glucose, Bld: 101 mg/dL — ABNORMAL HIGH (ref 70–99)
Potassium: 4.1 mmol/L (ref 3.5–5.1)
Sodium: 135 mmol/L (ref 135–145)

## 2023-08-26 LAB — TROPONIN I (HIGH SENSITIVITY): Troponin I (High Sensitivity): 1279 ng/L (ref <18)

## 2023-08-26 LAB — MAGNESIUM: Magnesium: 1.8 mg/dL (ref 1.7–2.4)
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
## 2023-08-26 NOTE — Progress Notes (Signed)
 PT Cancellation Note  Patient Details Name: BENJIMIN HADDEN MRN: [id number] DOB: 1995/01/28   Cancelled Treatment:    Reason Eval/Treat Not Completed: PT screened, no needs identified, will sign off. OT reports pt independent with mobility.   Richerd Lipoma, PT  Acute Rehab Services Secure chat preferred Office [phone]    Richerd LITTIE Lipoma 08/26/2023, 12:42 PM
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
## 2023-08-26 NOTE — TOC CAGE-AID Note (Signed)
 Transition of Care [facility]) - CAGE-AID Screening   Patient Details  Name: Dennis Rosales MRN: [id number] Date of Birth: Jun 24, 1995  [facility]) CM/SW Contact:    Thai Burgueno E Mikai Meints, LCSW Phone Number: 08/26/2023, 9:15 AM   Clinical Narrative: Declined SA.    CAGE-AID Screening:    Have You Ever Felt You Ought to Cut Down on Your Drinking or Drug Use?: No Have People Annoyed You By Critizing Your Drinking Or Drug Use?: No Have You Felt Bad Or Guilty About Your Drinking Or Drug Use?: No Have You Ever Had a Drink or Used Drugs First Thing In The Morning to Steady Your Nerves or to Get Rid of a Hangover?: No CAGE-AID Score: 0  Substance Abuse Education Offered: No
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
## 2023-08-27 ENCOUNTER — Inpatient Hospital Stay (HOSPITAL_BASED_OUTPATIENT_CLINIC_OR_DEPARTMENT_OTHER)
Admit: 2023-08-27 | Discharge: 2023-08-27 | Disposition: A | Discharge status: Home / Self Care | Attending: Physician Assistant | Admitting: Physician Assistant

## 2023-08-27 DIAGNOSIS — S2691XD Contusion of heart, unspecified with or without hemopericardium, subsequent encounter: Secondary | ICD-10-CM

## 2023-08-27 DIAGNOSIS — R002 Palpitations: Secondary | ICD-10-CM

## 2023-08-27 DIAGNOSIS — S2242XA Multiple fractures of ribs, left side, initial encounter for closed fracture: Secondary | ICD-10-CM | POA: Diagnosis not present

## 2023-08-27 LAB — BASIC METABOLIC PANEL WITH GFR
Anion gap: 13 (ref 5–15)
BUN: 6 mg/dL (ref 6–20)
CO2: 27 mmol/L (ref 22–32)
Calcium: 9.2 mg/dL (ref 8.9–10.3)
Chloride: 108 mmol/L (ref 98–111)
Creatinine, Ser: 0.97 mg/dL (ref 0.61–1.24)
GFR, Estimated: >60 mL/min (ref >60)
Glucose, Bld: 116 mg/dL — ABNORMAL HIGH (ref 70–99)
Potassium: 4.2 mmol/L (ref 3.5–5.1)
Sodium: 140 mmol/L (ref 135–145)

## 2023-08-27 LAB — CBC
HCT: 38.7 % — ABNORMAL LOW (ref 39.0–52.0)
Hemoglobin: 13.4 g/dL (ref 13.0–17.0)
MCH: 31.6 pg (ref 26.0–34.0)
MCHC: 34.6 g/dL (ref 30.0–36.0)
MCV: 91.3 fL (ref 80.0–100.0)
Platelets: 207 K/uL (ref 150–400)
RBC: 4.24 MIL/uL (ref 4.22–5.81)
RDW: 12 % (ref 11.5–15.5)
WBC: 8.9 K/uL (ref 4.0–10.5)
nRBC: 0 % (ref 0.0–0.2)

## 2023-08-27 LAB — MAGNESIUM: Magnesium: 2.1 mg/dL (ref 1.7–2.4)

## 2023-08-27 LAB — PHOSPHORUS: Phosphorus: 3.7 mg/dL (ref 2.5–4.6)

## 2023-08-27 LAB — TROPONIN I (HIGH SENSITIVITY): Troponin I (High Sensitivity): 888 ng/L (ref <18)

## 2023-08-27 MED ORDER — BACITRACIN ZINC 500 UNIT/GM EX OINT: TOPICAL_OINTMENT | Freq: Two times a day (BID) | CUTANEOUS | 0 refills | Status: DC

## 2023-08-27 MED ORDER — OXYCODONE HCL 5 MG PO TABS: 5.0000 mg | ORAL_TABLET | Freq: Four times a day (QID) | ORAL | 0 refills | Status: AC | PRN

## 2023-08-27 MED ORDER — METHOCARBAMOL 500 MG PO TABS: 500.0000 mg | ORAL_TABLET | Freq: Four times a day (QID) | ORAL | 0 refills | Status: DC | PRN

## 2023-08-27 MED ORDER — DOCUSATE SODIUM 100 MG PO CAPS
100.0000 mg | ORAL_CAPSULE | Freq: Two times a day (BID) | ORAL | 0 refills | Status: AC
Start: 2023-08-27 — End: ?

## 2023-08-27 MED ORDER — ACETAMINOPHEN 500 MG PO TABS: 1000.0000 mg | ORAL_TABLET | Freq: Four times a day (QID) | ORAL | 0 refills | Status: AC

## 2023-08-27 MED ORDER — POLYETHYLENE GLYCOL 3350 17 G PO PACK: 17.0000 g | PACK | Freq: Every day | ORAL | 0 refills | Status: AC | PRN

## 2023-08-27 NOTE — Progress Notes (Signed)
 Zio patch--home heart monitoring device has been placed on pt by EKG department staff.   Madelin Barefoot, RN SWOT
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
## 2023-08-27 NOTE — Progress Notes (Signed)
 Dressing changed to right knee and left elbow, pt and girlfriend who is at bedside showed how to apply bacitacin ointment, and vaseline gauze and wrap with kerlix twice a day. Both verbalized understanding and supplies given and ointment that was at bedside taken. Pt has home heart monitor on left chest.
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
## 2023-08-27 NOTE — Discharge Instructions (Addendum)
 Dennis Rosales

## 2023-09-10 ENCOUNTER — Ambulatory Visit (INDEPENDENT_AMBULATORY_CARE_PROVIDER_SITE_OTHER)

## 2023-09-10 VITALS — BP 116/74 | HR 81 | Resp 14 | Ht 70.0 in | Wt 210.6 lb

## 2023-09-10 DIAGNOSIS — S2243XD Multiple fractures of ribs, bilateral, subsequent encounter for fracture with routine healing: Secondary | ICD-10-CM | POA: Diagnosis not present

## 2023-09-10 DIAGNOSIS — F419 Anxiety disorder, unspecified: Secondary | ICD-10-CM | POA: Diagnosis not present

## 2023-09-10 DIAGNOSIS — E781 Pure hyperglyceridemia: Secondary | ICD-10-CM | POA: Diagnosis not present

## 2023-09-10 DIAGNOSIS — Z0001 Encounter for general adult medical examination with abnormal findings: Secondary | ICD-10-CM

## 2023-09-10 DIAGNOSIS — Z0000 Encounter for general adult medical examination without abnormal findings: Secondary | ICD-10-CM

## 2023-09-10 MED ORDER — PROPRANOLOL HCL 10 MG PO TABS: 10.0000 mg | ORAL_TABLET | Freq: Three times a day (TID) | ORAL | 3 refills | Status: DC | PRN

## 2023-09-10 NOTE — Patient Instructions (Signed)
To find a therapist:    [email or web address]
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
## 2023-09-10 NOTE — Assessment & Plan Note (Signed)
 Hx of elevated cholesterol and hypertriglyceridemia. Will recheck lipid panel today.  Lab Results  Component Value Date   CHOL 234 (H) 11/03/2022   HDL 44 11/03/2022   LDLCALC 146 (H) 11/03/2022   TRIG 240 (H) 11/03/2022   CHOLHDL 5.3 (H) 11/03/2022
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
## 2023-09-10 NOTE — Assessment & Plan Note (Signed)
 Here for annual exam for work. Form filled out today.
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
## 2023-09-11 LAB — LIPID PANEL
Chol/HDL Ratio: 5.2 ratio — ABNORMAL HIGH (ref 0.0–5.0)
Cholesterol, Total: 219 mg/dL — ABNORMAL HIGH (ref 100–199)
HDL: 42 mg/dL (ref >39)
LDL Chol Calc (NIH): 156 mg/dL — ABNORMAL HIGH (ref 0–99)
Triglycerides: 115 mg/dL (ref 0–149)
VLDL Cholesterol Cal: 21 mg/dL (ref 5–40)

## 2023-09-13 ENCOUNTER — Ambulatory Visit: Payer: Self-pay
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
## 2023-09-15 ENCOUNTER — Ambulatory Visit: Attending: Physician Assistant | Admitting: Physician Assistant

## 2023-09-15 ENCOUNTER — Encounter: Payer: Self-pay | Admitting: Physician Assistant

## 2023-09-15 VITALS — BP 118/70 | HR 85 | Ht 71.0 in | Wt 220.6 lb

## 2023-09-15 DIAGNOSIS — S2691XD Contusion of heart, unspecified with or without hemopericardium, subsequent encounter: Secondary | ICD-10-CM

## 2023-09-15 DIAGNOSIS — R002 Palpitations: Secondary | ICD-10-CM | POA: Diagnosis not present

## 2023-09-15 DIAGNOSIS — S2243XD Multiple fractures of ribs, bilateral, subsequent encounter for fracture with routine healing: Secondary | ICD-10-CM | POA: Diagnosis not present

## 2023-09-15 NOTE — Patient Instructions (Signed)
 Medication Instructions:  Voltaren Gel otc as needed  *If you need a refill on your cardiac medications before your next appointment, please call your pharmacy*  Lab Work: NONE ordered at this time of appointment   Testing/Procedures: NONE ordered at this time of appointment   Follow-Up: At [facility], you and your health needs are our priority.  As part of our continuing mission to provide you with exceptional heart care, our providers are all part of one team.  This team includes your primary Cardiologist (physician) and Advanced Practice Providers or APPs (Physician Assistants and Nurse Practitioners) who all work together to provide you with the care you need, when you need it.  Your next appointment:    As needed  Provider:   Maude Emmer, MD    We recommend signing up for the patient portal called MyChart.  Sign up information is provided on this After Visit Summary.  MyChart is used to connect with patients for Virtual Visits (Telemedicine).  Patients are able to view lab/test results, encounter notes, upcoming appointments, etc.  Non-urgent messages can be sent to your provider as well.   To learn more about what you can do with MyChart, go to [email or web address].

## 2023-09-15 NOTE — Progress Notes (Signed)
 Left message with Mom (sheila) to make pt aware to return call regarding test results.  Called pt phone but unable to leave a message mailbox was full.

## 2023-09-21 ENCOUNTER — Ambulatory Visit: Payer: Self-pay | Admitting: Physician Assistant

## 2023-09-21 DIAGNOSIS — R002 Palpitations: Secondary | ICD-10-CM | POA: Diagnosis not present

## 2023-09-21 NOTE — Addendum Note (Signed)
 Encounter addended by: Malvina Pina A on: 09/21/2023 8:09 AM  Actions taken: Imaging Exam ended

## 2023-09-28 ENCOUNTER — Telehealth: Payer: Self-pay

## 2023-09-28 NOTE — Telephone Encounter (Signed)
 Pt is requesting refill on methocarbamol  (ROBAXIN ) 500 MG tablet.  He uses [facility] [address].

## 2023-09-29 ENCOUNTER — Other Ambulatory Visit: Payer: Self-pay

## 2023-09-29 MED ORDER — METHOCARBAMOL 500 MG PO TABS: 500.0000 mg | ORAL_TABLET | Freq: Four times a day (QID) | ORAL | 0 refills | Status: AC | PRN

## 2023-09-29 NOTE — Telephone Encounter (Signed)
 LOV 09/10/23 NOV 09/12/24 LRF 08/27/23 50 x 0

## 2023-10-11 ENCOUNTER — Ambulatory Visit: Admitting: Physician Assistant

## 2024-02-03 ENCOUNTER — Other Ambulatory Visit: Payer: Self-pay

## 2024-02-03 ENCOUNTER — Emergency Department
Admission: EM | Admit: 2024-02-03 | Discharge: 2024-02-03 | Disposition: A | Discharge status: Home / Self Care | Attending: Emergency Medicine | Admitting: Emergency Medicine

## 2024-02-03 DIAGNOSIS — W01198A Fall on same level from slipping, tripping and stumbling with subsequent striking against other object, initial encounter: Secondary | ICD-10-CM | POA: Diagnosis not present

## 2024-02-03 DIAGNOSIS — S01111A Laceration without foreign body of right eyelid and periocular area, initial encounter: Secondary | ICD-10-CM | POA: Insufficient documentation

## 2024-02-03 DIAGNOSIS — S0181XA Laceration without foreign body of other part of head, initial encounter: Secondary | ICD-10-CM

## 2024-02-03 DIAGNOSIS — S0993XA Unspecified injury of face, initial encounter: Secondary | ICD-10-CM | POA: Diagnosis present

## 2024-02-03 MED ORDER — LIDOCAINE HCL (PF) 1 % IJ SOLN
5.0000 mL | Freq: Once | INTRAMUSCULAR | Status: AC
  Administered 2024-02-03: 5 mL via INTRADERMAL
  Filled 2024-02-03: qty 5

## 2024-02-03 NOTE — Discharge Instructions (Signed)
 The sutures will need to be removed in 5 days.  This can be done by your primary care provider, urgent care or the emergency department.  Please wash the wound with soap and water daily and then pat dry.  You can cover with a bandage if you would like. Watch for signs of infection including redness, warmth, swelling, pain and pus drainage.  If you develop any of these please return to the ED, urgent care or your primary care provider.

## 2024-02-03 NOTE — ED Provider Notes (Signed)
 "  [facility] Provider Note    Event Date/Time   First MD Initiated Contact with Patient 02/03/24 1235     (approximate)   History   Facial Laceration   HPI  MICHAEAL Rosales is a 29 y.o. male with no PMH presents for evaluation of a laceration to the right side of his face by the eyebrow.  Patient states he slipped and fell on ice and hit his face on a plastic bucket.  No LOC, vomiting or visual changes.  Patient states that it does not hurt much but that it bled a lot initially.  Patient is not sure when his last tetanus shot was and does not want to update it today.      Physical Exam   Triage Vital Signs: ED Triage Vitals  Encounter Vitals Group     BP 02/03/24 1217 (!) 132/94     Girls Systolic BP Percentile --      Girls Diastolic BP Percentile --      Boys Systolic BP Percentile --      Boys Diastolic BP Percentile --      Pulse Rate 02/03/24 1217 82     Resp 02/03/24 1217 17     Temp 02/03/24 1217 97.6 F (36.4 C)     Temp src --      SpO2 02/03/24 1217 96 %     Weight 02/03/24 1218 220 lb (99.8 kg)     Height 02/03/24 1218 5' 11 (1.803 m)     Head Circumference --      Peak Flow --      Pain Score 02/03/24 1218 2     Pain Loc --      Pain Education --      Exclude from Growth Chart --     Most recent vital signs: Vitals:   02/03/24 1217  BP: (!) 132/94  Pulse: 82  Resp: 17  Temp: 97.6 F (36.4 C)  SpO2: 96%   General: Awake, no distress.  CV:  Good peripheral perfusion.  Resp:  Normal effort.  Abd:  No distention.  Other:  1.5 cm linear laceration just below the right eyebrow, no focal neurodeficits.   ED Results / Procedures / Treatments   Labs (all labs ordered are listed, but only abnormal results are displayed) Labs Reviewed - No data to display    PROCEDURES:  Critical Care performed: No  .Laceration Repair  Date/Time: 02/03/2024 1:45 PM  Performed by: Cleaster Tinnie LABOR, PA-C Authorized by:  Cleaster Tinnie LABOR, PA-C   Consent:    Consent obtained:  Verbal   Consent given by:  Patient   Risks, benefits, and alternatives were discussed: yes     Risks discussed:  Infection, pain, poor cosmetic result and poor wound healing   Alternatives discussed:  No treatment Universal protocol:    Patient identity confirmed:  Verbally with patient Anesthesia:    Anesthesia method:  Local infiltration   Local anesthetic:  Lidocaine  1% w/o epi Laceration details:    Location:  Face   Face location:  R eyebrow   Length (cm):  1.5   Depth (mm):  5 Pre-procedure details:    Preparation:  Patient was prepped and draped in usual sterile fashion Exploration:    Hemostasis achieved with:  Direct pressure   Wound exploration: entire depth of wound visualized     Contaminated: no   Treatment:    Area cleansed with:  Povidone-iodine   Amount  of cleaning:  Standard   Irrigation solution:  Sterile saline Skin repair:    Repair method:  Sutures   Suture size:  6-0   Suture material:  Prolene   Suture technique:  Simple interrupted   Number of sutures:  2 Approximation:    Approximation:  Close Repair type:    Repair type:  Simple Post-procedure details:    Dressing:  Open (no dressing)   Procedure completion:  Tolerated well, no immediate complications    MEDICATIONS ORDERED IN ED: Medications  lidocaine  (PF) (XYLOCAINE ) 1 % injection 5 mL (has no administration in time range)     IMPRESSION / MDM / ASSESSMENT AND PLAN / ED COURSE  I reviewed the triage vital signs and the nursing notes.                             29 year old male presents for evaluation of a laceration to the face.  Blood pressure was a little bit elevated but vital signs are stable otherwise.  Patient NAD on exam.  Differential diagnosis includes, but is not limited to, laceration, contusion, abrasion, concussion, closed head injury.  Patient's presentation is most consistent with acute, uncomplicated  illness.  Discussed closing laceration using sutures versus glue.  Patient would prefer sutures.  See procedure note for further details.  Given the patient did hit his head I offered CT scan to evaluate for intracranial bleed and skull fracture.  Patient declined imaging at this time.  Patient has minimal symptoms so I have low suspicion for concussion.  Patient did not want his tetanus shot updated today.  We discussed further wound care.  Patient voiced understanding, all questions were answered he stable at discharge.       FINAL CLINICAL IMPRESSION(S) / ED DIAGNOSES   Final diagnoses:  Facial laceration, initial encounter     Rx / DC Orders   ED Discharge Orders     None        Note:  This document was prepared using Dragon voice recognition software and may include unintentional dictation errors.   Cleaster Tinnie LABOR, PA-C 02/03/24 1348    Ernest Ronal BRAVO, MD 02/04/24 [phone]  "

## 2024-02-03 NOTE — ED Triage Notes (Signed)
 Pt comes in via pov after slipping and falling on ice. Pt with a laceration above the right outer edge of eyebrow. Pt denies LOC, pt complains of pain 2/10. Bleeding is controlled at this time.

## 2024-02-03 NOTE — ED Notes (Signed)
 Medication given to Lauren D. PA-C.

## 2024-02-11 ENCOUNTER — Ambulatory Visit

## 2024-02-11 VITALS — BP 119/72 | HR 70 | Temp 97.9°F | Wt 225.3 lb

## 2024-02-11 DIAGNOSIS — F419 Anxiety disorder, unspecified: Secondary | ICD-10-CM

## 2024-02-11 MED ORDER — ESCITALOPRAM OXALATE 10 MG PO TABS: 10.0000 mg | ORAL_TABLET | Freq: Every day | ORAL | 3 refills | Status: AC

## 2024-02-11 NOTE — Progress Notes (Signed)
 "    Established patient visit   Patient: Dennis Rosales   DOB: [birth year]   28 y.o. Male  MRN: [id number] Visit Date: 02/11/2024  Today's healthcare provider: Isaiah DELENA Pepper, MD   Chief Complaint  Patient presents with   Med Change Request    Pt states that the new medication is not helping with his anxiety. Would like something else   Subjective    HPI  Discussed the use of AI scribe software for clinical note transcription with the patient, who gave verbal consent to proceed.  History of Present Illness Dennis Rosales is a 29 year old male who presents with anxiety and irritability.  He has been experiencing significant anxiety and irritability, feeling on edge and like he could 'snap' at people both at work and in general. No panic attacks, but he feels easily irritated by minor issues. He denies thoughts of self-harm.  He is currently taking his anxiety medication three times a day without relief and is concerned about not wanting to be on addictive medications. He has been taking propranolol  as needed, but reports it has not been effective. He is not on any other medications for anxiety.  Family issues, particularly not being able to see his daughter, have been contributing to his stress and irritability. He works a 6 AM to 6 PM shift, Monday through Friday, and notes that his work environment adds to his stress levels.   Medications: Show/hide medication list[1]  Review of Systems as noted in HPI.      Objective    BP 119/72   Pulse 70   Temp 97.9 F (36.6 C) (Oral)   Wt 225 lb 4.8 oz (102.2 kg)   BMI 31.42 kg/m    Physical Exam Constitutional:      Appearance: Normal appearance.  HENT:     Head: Normocephalic and atraumatic.     Mouth/Throat:     Mouth: Mucous membranes are moist.  Eyes:     Pupils: Pupils are equal, round, and reactive to light.  Pulmonary:     Effort: Pulmonary effort is normal.  Skin:    General: Skin is warm.   Neurological:     General: No focal deficit present.     Mental Status: He is alert.      No results found for any visits on 02/11/24.  Assessment & Plan     Problem List Items Addressed This Visit       Other   Anxiety - Primary   Relevant Medications   escitalopram  (LEXAPRO ) 10 MG tablet    Assessment & Plan Anxiety Chronic, uncontrolled. Current treatment with as-needed propranolol  is ineffective. Lexapro  (escitalopram ) is recommended for daily use to manage anxiety and irritability. He prefers not to engage in therapy at this time. - Prescribed Lexapro  (escitalopram ) 10mg  daily - Discontinued propranolol  - Scheduled follow-up appointment in six weeks to assess response to Lexapro .   Return in about 6 weeks (around 03/24/2024) for Follow Up.       Isaiah DELENA Pepper, MD  [facility] [phone] (phone) [phone] (fax)     [1]  Outpatient Medications Prior to Visit  Medication Sig   [DISCONTINUED] propranolol  (INDERAL ) 10 MG tablet Take 1 tablet (10 mg total) by mouth 3 (three) times daily as needed (anxiety).   acetaminophen  (TYLENOL ) 500 MG tablet Take 2 tablets (1,000 mg total) by mouth every 6 (six) hours. (Patient not taking: Reported on 02/11/2024)   docusate sodium  (COLACE)  100 MG capsule Take 1 capsule (100 mg total) by mouth 2 (two) times daily. (Patient not taking: Reported on 02/11/2024)   methocarbamol  (ROBAXIN ) 500 MG tablet Take 1 tablet (500 mg total) by mouth every 6 (six) hours as needed for muscle spasms. (Patient not taking: Reported on 02/11/2024)   oxyCODONE  (OXY IR/ROXICODONE ) 5 MG immediate release tablet Take 1 tablet (5 mg total) by mouth every 6 (six) hours as needed for severe pain (pain score 7-10). (Patient not taking: Reported on 02/11/2024)   polyethylene glycol (MIRALAX  / GLYCOLAX ) 17 g packet Take 17 g by mouth daily as needed (constipation). (Patient not taking: Reported on 02/11/2024)   No facility-administered  medications prior to visit.   "

## 2024-03-24 ENCOUNTER — Ambulatory Visit

## 2024-09-12 ENCOUNTER — Encounter
